# Patient Record
Sex: Female | Born: 1990 | Hispanic: Yes | Marital: Single | Smoking: Never smoker
Health system: Southern US, Community
[De-identification: ages and names within clinical notes are randomized; demographics above are authoritative.]

## PROBLEM LIST (undated history)

## (undated) DIAGNOSIS — O1410 Severe pre-eclampsia, unspecified trimester: Secondary | ICD-10-CM

## (undated) HISTORY — DX: Severe pre-eclampsia, unspecified trimester: O14.10

## (undated) HISTORY — PX: CHOLECYSTECTOMY: SHX55

---
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
## 2010-12-26 ENCOUNTER — Emergency Department (HOSPITAL_COMMUNITY): Payer: No Typology Code available for payment source

## 2010-12-26 ENCOUNTER — Emergency Department (HOSPITAL_COMMUNITY)
Admission: EM | Admit: 2010-12-26 | Discharge: 2010-12-26 | Disposition: A | Discharge status: Home / Self Care | Payer: No Typology Code available for payment source | Attending: General Surgery | Admitting: General Surgery

## 2010-12-26 DIAGNOSIS — S60229A Contusion of unspecified hand, initial encounter: Secondary | ICD-10-CM | POA: Insufficient documentation

## 2010-12-26 DIAGNOSIS — R079 Chest pain, unspecified: Secondary | ICD-10-CM | POA: Insufficient documentation

## 2010-12-26 DIAGNOSIS — S20219A Contusion of unspecified front wall of thorax, initial encounter: Secondary | ICD-10-CM | POA: Insufficient documentation

## 2010-12-26 DIAGNOSIS — M79609 Pain in unspecified limb: Secondary | ICD-10-CM | POA: Insufficient documentation

## 2010-12-26 DIAGNOSIS — R51 Headache: Secondary | ICD-10-CM | POA: Insufficient documentation

## 2010-12-26 DIAGNOSIS — S301XXA Contusion of abdominal wall, initial encounter: Secondary | ICD-10-CM | POA: Insufficient documentation

## 2010-12-26 DIAGNOSIS — R404 Transient alteration of awareness: Secondary | ICD-10-CM | POA: Insufficient documentation

## 2010-12-26 DIAGNOSIS — S51009A Unspecified open wound of unspecified elbow, initial encounter: Secondary | ICD-10-CM | POA: Insufficient documentation

## 2010-12-26 DIAGNOSIS — N390 Urinary tract infection, site not specified: Secondary | ICD-10-CM | POA: Insufficient documentation

## 2010-12-26 DIAGNOSIS — Y92410 Unspecified street and highway as the place of occurrence of the external cause: Secondary | ICD-10-CM | POA: Insufficient documentation

## 2010-12-26 DIAGNOSIS — M25529 Pain in unspecified elbow: Secondary | ICD-10-CM | POA: Insufficient documentation

## 2010-12-26 DIAGNOSIS — R1032 Left lower quadrant pain: Secondary | ICD-10-CM | POA: Insufficient documentation

## 2010-12-26 DIAGNOSIS — M25559 Pain in unspecified hip: Secondary | ICD-10-CM | POA: Insufficient documentation

## 2010-12-26 LAB — URINALYSIS, ROUTINE W REFLEX MICROSCOPIC
Bilirubin Urine: NEGATIVE
Specific Gravity, Urine: 1.014 (ref 1.005–1.030)
pH: 5.5 (ref 5.0–8.0)

## 2010-12-26 LAB — POCT I-STAT, CHEM 8
BUN: 7 mg/dL (ref 6–23)
Chloride: 106 meq/L (ref 96–112)
Creatinine, Ser: 0.8 mg/dL (ref 0.4–1.2)
Hemoglobin: 14.6 g/dL (ref 12.0–15.0)
Potassium: 3.6 meq/L (ref 3.5–5.1)
Sodium: 138 meq/L (ref 135–145)

## 2010-12-26 LAB — CBC
HCT: 41.1 % (ref 36.0–46.0)
Hemoglobin: 14.6 g/dL (ref 12.0–15.0)
MCH: 29.1 pg (ref 26.0–34.0)
RBC: 5.01 MIL/uL (ref 3.87–5.11)

## 2010-12-26 LAB — COMPREHENSIVE METABOLIC PANEL
AST: 39 U/L — ABNORMAL HIGH (ref 0–37)
Albumin: 4.1 g/dL (ref 3.5–5.2)
Calcium: 9.2 mg/dL (ref 8.4–10.5)
Chloride: 105 meq/L (ref 96–112)
Creatinine, Ser: 0.66 mg/dL (ref 0.4–1.2)
GFR calc Af Amer: >60 mL/min (ref >60)
Sodium: 135 meq/L (ref 135–145)

## 2010-12-26 LAB — POCT I-STAT 3, ART BLOOD GAS (G3+)
Patient temperature: 98.6
pH, Arterial: 7.421 — ABNORMAL HIGH (ref 7.350–7.400)

## 2010-12-26 LAB — URINE MICROSCOPIC-ADD ON

## 2010-12-26 LAB — LACTIC ACID, PLASMA: Lactic Acid, Venous: 2.1 mmol/L (ref 0.5–2.2)

## 2010-12-26 LAB — PROTIME-INR
INR: 1.04 (ref 0.00–1.49)
Prothrombin Time: 13.8 s (ref 11.6–15.2)

## 2010-12-26 MED ORDER — IOHEXOL 300 MG/ML  SOLN
100.0000 mL | Freq: Once | INTRAMUSCULAR | Status: AC | PRN
  Administered 2010-12-26: 100 mL via INTRAVENOUS

## 2014-07-05 LAB — OB RESULTS CONSOLE HGB/HCT, BLOOD
HEMATOCRIT: 37 %
HEMATOCRIT: 37 %
HEMOGLOBIN: 13.5 g/dL
Hemoglobin: 13.5 g/dL

## 2014-07-16 LAB — OB RESULTS CONSOLE GC/CHLAMYDIA
Chlamydia: NEGATIVE
Gonorrhea: NEGATIVE

## 2014-07-16 LAB — OB RESULTS CONSOLE RPR: RPR: NONREACTIVE

## 2014-07-16 LAB — OB RESULTS CONSOLE RUBELLA ANTIBODY, IGM: RUBELLA: IMMUNE

## 2014-07-16 LAB — OB RESULTS CONSOLE ANTIBODY SCREEN: ANTIBODY SCREEN: NEGATIVE

## 2014-07-16 LAB — OB RESULTS CONSOLE HEPATITIS B SURFACE ANTIGEN: HEP B S AG: NEGATIVE

## 2014-07-16 LAB — CYTOLOGY - PAP: Pap Smear: NEGATIVE

## 2014-07-16 LAB — OB RESULTS CONSOLE HIV ANTIBODY (ROUTINE TESTING): HIV: NONREACTIVE

## 2014-07-17 ENCOUNTER — Encounter: Payer: Self-pay | Admitting: Obstetrics & Gynecology

## 2014-07-19 LAB — GLUCOSE TOLERANCE, 1 HOUR (50G) W/O FASTING: Glucose, GTT - 1 Hour: 74 mg/dL (ref ?–200)

## 2014-07-24 ENCOUNTER — Encounter: Payer: Self-pay | Admitting: Obstetrics & Gynecology

## 2014-07-25 ENCOUNTER — Encounter: Payer: Self-pay | Admitting: *Deleted

## 2014-08-02 ENCOUNTER — Encounter: Payer: Self-pay | Admitting: Obstetrics & Gynecology

## 2014-08-02 ENCOUNTER — Ambulatory Visit (INDEPENDENT_AMBULATORY_CARE_PROVIDER_SITE_OTHER): Payer: BC Managed Care – PPO | Admitting: Family

## 2014-08-02 ENCOUNTER — Encounter: Payer: Self-pay | Admitting: Family

## 2014-08-02 ENCOUNTER — Encounter (INDEPENDENT_AMBULATORY_CARE_PROVIDER_SITE_OTHER): Payer: Self-pay

## 2014-08-02 ENCOUNTER — Telehealth: Payer: Self-pay | Admitting: General Practice

## 2014-08-02 VITALS — BP 101/54 | HR 82 | Temp 98.3°F | Ht 62.0 in | Wt 222.4 lb

## 2014-08-02 DIAGNOSIS — O2341 Unspecified infection of urinary tract in pregnancy, first trimester: Secondary | ICD-10-CM

## 2014-08-02 DIAGNOSIS — O09212 Supervision of pregnancy with history of pre-term labor, second trimester: Secondary | ICD-10-CM

## 2014-08-02 DIAGNOSIS — N390 Urinary tract infection, site not specified: Secondary | ICD-10-CM

## 2014-08-02 DIAGNOSIS — O0992 Supervision of high risk pregnancy, unspecified, second trimester: Secondary | ICD-10-CM

## 2014-08-02 DIAGNOSIS — O0990 Supervision of high risk pregnancy, unspecified, unspecified trimester: Secondary | ICD-10-CM | POA: Insufficient documentation

## 2014-08-02 DIAGNOSIS — O09219 Supervision of pregnancy with history of pre-term labor, unspecified trimester: Secondary | ICD-10-CM | POA: Insufficient documentation

## 2014-08-02 DIAGNOSIS — O09292 Supervision of pregnancy with other poor reproductive or obstetric history, second trimester: Secondary | ICD-10-CM

## 2014-08-02 DIAGNOSIS — O09299 Supervision of pregnancy with other poor reproductive or obstetric history, unspecified trimester: Secondary | ICD-10-CM | POA: Insufficient documentation

## 2014-08-02 LAB — POCT URINALYSIS DIP (DEVICE)
GLUCOSE, UA: NEGATIVE mg/dL
Ketones, ur: 15 mg/dL — AB
NITRITE: POSITIVE — AB
Protein, ur: 30 mg/dL — AB
Specific Gravity, Urine: 1.02 (ref 1.005–1.030)
UROBILINOGEN UA: 1 mg/dL (ref 0.0–1.0)
pH: 6 (ref 5.0–8.0)

## 2014-08-02 MED ORDER — CEPHALEXIN 500 MG PO CAPS: 500.0000 mg | ORAL_CAPSULE | Freq: Three times a day (TID) | ORAL | Status: DC

## 2014-08-02 NOTE — Telephone Encounter (Signed)
Patient called and left message in spanish, had Darl PikesSusan listen to message. Patient calling stating her job is giving her a hard time about having to come here often for appointments and she needs a letter stating she has a high-risk pregnancy and will need to be here often for appointments. Dr Macon LargeAnyanwu wrote letter for patient. Called patient with Darl PikesSusan to inform patient of letter waiting in the office for her to come by and pickup. Patient verbalized understanding and had no other questions

## 2014-08-02 NOTE — Addendum Note (Signed)
Addended by: Aldona LentoFISHER, Tynetta Bachmann L on: 08/02/2014 11:35 AM   Modules accepted: Orders

## 2014-08-02 NOTE — Addendum Note (Signed)
Addended by: Marlis EdelsonKARIM, Mariana Wiederholt N on: 08/02/2014 11:30 AM   Modules accepted: Orders

## 2014-08-02 NOTE — Progress Notes (Signed)
[facility] and requested records as we do not yet have them. Patient reports having all blood work and early glucose testing-- unsure of results.  Reports bilateral hip pain and lower back pain-- especially after walking.  New OB packet given.  Discussed appropriate weight gain(11-20lb); pt. Verbalized understanding.

## 2014-08-02 NOTE — Progress Notes (Signed)
Pt is a transfer from health dept for hx of SGA and preterm delivery.  Pt had all lab work done there.  Need to obtain records.  Pt reports FOB recently detained by immigration services and will be deported back to [place] > to Child psychotherapistsocial worker to discuss resources.  Currently works as [employer].  Undecided about genetic testing.  Discussed and offered 17-p; pt will completed paperwork today.  Nitrites in urine; RX Keflex given to pt and urine culture sent.

## 2014-08-04 LAB — CULTURE, OB URINE: Colony Count: >=100000

## 2014-08-08 ENCOUNTER — Encounter: Payer: Self-pay | Admitting: *Deleted

## 2014-08-20 ENCOUNTER — Encounter: Payer: Self-pay | Admitting: *Deleted

## 2014-08-23 ENCOUNTER — Encounter: Payer: Self-pay | Admitting: Family

## 2014-08-23 ENCOUNTER — Ambulatory Visit (INDEPENDENT_AMBULATORY_CARE_PROVIDER_SITE_OTHER): Payer: BC Managed Care – PPO | Admitting: Family

## 2014-08-23 VITALS — BP 102/80 | HR 86 | Temp 98.4°F | Wt 223.8 lb

## 2014-08-23 DIAGNOSIS — Z23 Encounter for immunization: Secondary | ICD-10-CM | POA: Diagnosis not present

## 2014-08-23 DIAGNOSIS — O0992 Supervision of high risk pregnancy, unspecified, second trimester: Secondary | ICD-10-CM

## 2014-08-23 LAB — POCT URINALYSIS DIP (DEVICE)
Bilirubin Urine: NEGATIVE
Glucose, UA: NEGATIVE mg/dL
Hgb urine dipstick: NEGATIVE
Ketones, ur: 15 mg/dL — AB
Leukocytes, UA: NEGATIVE
NITRITE: NEGATIVE
Protein, ur: NEGATIVE mg/dL
Specific Gravity, Urine: 1.02 (ref 1.005–1.030)
Urobilinogen, UA: 1 mg/dL (ref 0.0–1.0)
pH: 7 (ref 5.0–8.0)

## 2014-08-23 NOTE — Progress Notes (Signed)
Patient never started keflex.

## 2014-08-23 NOTE — Progress Notes (Signed)
No questions or concerns.  Desires quad screen.  Obtain in three weeks with appt.  Scheduled OB ultrasound for 19 wks.  Return next week to begin 17-p.

## 2014-08-27 ENCOUNTER — Encounter: Payer: Self-pay | Admitting: Family

## 2014-08-30 ENCOUNTER — Ambulatory Visit: Payer: BC Managed Care – PPO | Admitting: General Practice

## 2014-08-30 ENCOUNTER — Encounter: Payer: Self-pay | Admitting: Obstetrics & Gynecology

## 2014-08-30 VITALS — BP 118/67 | HR 67 | Temp 98.2°F | Ht 62.0 in | Wt 222.3 lb

## 2014-08-30 DIAGNOSIS — Z719 Counseling, unspecified: Secondary | ICD-10-CM

## 2014-08-30 NOTE — Progress Notes (Signed)
Patient here today for 17p. 17p not in office thus not administered today. Refaxed makena paperwork and asked to send asap.

## 2014-09-06 ENCOUNTER — Encounter: Payer: Self-pay | Admitting: *Deleted

## 2014-09-06 ENCOUNTER — Ambulatory Visit: Payer: BC Managed Care – PPO

## 2014-09-06 ENCOUNTER — Encounter: Payer: Self-pay | Admitting: Obstetrics and Gynecology

## 2014-09-06 NOTE — Progress Notes (Signed)
/  [facility] 08[facility]/14.  [facility] states they did not receive application.  Application refaxed on 08/30/14.  I received an email from [place] [facility] on 08/31/14 stating that the medication was covered under the patient's BCBS of [place] PPO.  No pre-auth was required and the [facility] will [facility].  Today patient comes to office for injection and medication has not been delivered to the office.  Phone call to [facility] at [phone].  Spoke with Kathlene NovemberMike who entered patient's demographic information.  Then spoke with Iris a pharmacist to give verbal order.  Explained situation and the need to get medication ASAP.  Iris states she will put a STAT on it from their perspective but they will still need to speak to the patient to get authorization to ship the medication to our office.  I asked if the patient could call them.  Iris states that will be fine but to ask patient not to call until sometime between 4-5 pm today as they will need a little time to get the information loaded into their system so they will be able to properly see her information when she calls in.  Phoned patient.  Explained situation to patient.  Asked patient to call [facility] at [phone] between 4 - 5 pm today.  Told patient to tell them she needs to check on the status of her medication and authorize shipment to the provider office.  Patient states understanding.  Explained that if patient calls today, we should have her medication by Monday of next week.  Patient has next appointment on 09/13/14.  She will see the provider and get her makena injection that day.  Patient declined to come into office on Monday for injection only due to transportation issues.  Will follow-up with call to [facility] tomorrow to be certain medication is being shipped ASAP.

## 2014-09-07 ENCOUNTER — Encounter: Payer: Self-pay | Admitting: *Deleted

## 2014-09-07 NOTE — Progress Notes (Signed)
Phone call to [facility] [phone], spoke with Arlene.  States she doesn't see where the patient has phoned in to give authorization [facility].  I placed her on hold and called patient and connected her to get issue resolved.  Patient states she phoned [facility] yesterday and was told they were trying to verify benefits.  While on the phone with Arlene the patient did authorize that makena be shipped to our office.  Arlene states once benefits are verified (which will take 24-48 hours) they will reach out to patient to let her know of any copay which she would be responsible for.  Patient agrees and states understanding.  I will follow up with [facility] again on Monday to ensure the issue has been resolved and medication will be available in our office for the patient at her next appointment on 09/13/14.

## 2014-09-11 ENCOUNTER — Telehealth: Payer: Self-pay

## 2014-09-11 NOTE — Telephone Encounter (Signed)
17P has still not arrived in office. Called [facility] [phone] and spoke to BuxtonShanise. She states they had tried to contact patient today for consent and were unable to reach her. [facility] and spoke to [place]. She informed me that benefits were verified later on during the day on 09/07/14. Ashante states patient could not have given consent until benefits were verified. They attempted to contact patient today to inform her of coverage and obtain consent. Were unable to reach her. States patient needs to contact [facility] at number listed above-- hit option 3 and then option 1. Once consent is obtained, 17P will then be shipped. Called patient with interpreter [phone]. Informed her [facility] has been trying to reach her. Gave her number to reach [facility] as well as directions to obtain correct contact person. Patient stated as soon as she gets off phone she will call. No questions or concerns.

## 2014-09-12 ENCOUNTER — Encounter: Payer: Self-pay | Admitting: *Deleted

## 2014-09-12 NOTE — Progress Notes (Signed)
Spoke with Efraim KaufmannMelissa at [facility] [phone].  States benefits have been verified and they have spoken with patient to let her know.  They have scheduled delivery of medication to be overnighted.  Will arrive via UPS at approximately 10:30 am on 09/12/14.  Patient has appointment on Thursday 09/13/14.  Melissa states they will check benefits when refill is required and will contact us to arrange delivery of refill once they have spoken with the patient.

## 2014-09-13 ENCOUNTER — Ambulatory Visit (INDEPENDENT_AMBULATORY_CARE_PROVIDER_SITE_OTHER): Payer: BC Managed Care – PPO | Admitting: Family Medicine

## 2014-09-13 ENCOUNTER — Encounter: Payer: Self-pay | Admitting: Family Medicine

## 2014-09-13 VITALS — BP 103/64 | HR 88 | Temp 98.1°F | Wt 222.3 lb

## 2014-09-13 DIAGNOSIS — Z8751 Personal history of pre-term labor: Secondary | ICD-10-CM | POA: Diagnosis not present

## 2014-09-13 DIAGNOSIS — O0992 Supervision of high risk pregnancy, unspecified, second trimester: Secondary | ICD-10-CM

## 2014-09-13 DIAGNOSIS — G4489 Other headache syndrome: Secondary | ICD-10-CM

## 2014-09-13 LAB — POCT URINALYSIS DIP (DEVICE)
GLUCOSE, UA: NEGATIVE mg/dL
Ketones, ur: 40 mg/dL — AB
Leukocytes, UA: NEGATIVE
NITRITE: NEGATIVE
PH: 6 (ref 5.0–8.0)
Protein, ur: NEGATIVE mg/dL
Specific Gravity, Urine: 1.025 (ref 1.005–1.030)
Urobilinogen, UA: 1 mg/dL (ref 0.0–1.0)

## 2014-09-13 MED ORDER — CYCLOBENZAPRINE HCL 10 MG PO TABS: 10.0000 mg | ORAL_TABLET | Freq: Three times a day (TID) | ORAL | Status: AC | PRN

## 2014-09-13 MED ORDER — HYDROXYPROGESTERONE CAPROATE 250 MG/ML IM OIL
250.0000 mg | TOPICAL_OIL | INTRAMUSCULAR | Status: AC
  Administered 2014-09-13 – 2015-01-17 (×15): 250 mg via INTRAMUSCULAR

## 2014-09-13 NOTE — Progress Notes (Signed)
Quad screen today Flexeril for headache 17P today and weekly May see Nutrition--discussed normal fetal growth.

## 2014-09-13 NOTE — Patient Instructions (Signed)
Second Trimester of Pregnancy The second trimester is from week 13 through week 28, months 4 through 6. The second trimester is often a time when you feel your best. Your body has also adjusted to being pregnant, and you begin to feel better physically. Usually, morning sickness has lessened or quit completely, you may have more energy, and you may have an increase in appetite. The second trimester is also a time when the fetus is growing rapidly. At the end of the sixth month, the fetus is about 9 inches long and weighs about 1 pounds. You will likely begin to feel the baby move (quickening) between 18 and 20 weeks of the pregnancy. BODY CHANGES Your body goes through many changes during pregnancy. The changes vary from woman to woman.   Your weight will continue to increase. You will notice your lower abdomen bulging out.  You may begin to get stretch marks on your hips, abdomen, and breasts.  You may develop headaches that can be relieved by medicines approved by your health care provider.  You may urinate more often because the fetus is pressing on your bladder.  You may develop or continue to have heartburn as a result of your pregnancy.  You may develop constipation because certain hormones are causing the muscles that push waste through your intestines to slow down.  You may develop hemorrhoids or swollen, bulging veins (varicose veins).  You may have back pain because of the weight gain and pregnancy hormones relaxing your joints between the bones in your pelvis and as a result of a shift in weight and the muscles that support your balance.  Your breasts will continue to grow and be tender.  Your gums may bleed and may be sensitive to brushing and flossing.  Dark spots or blotches (chloasma, mask of pregnancy) may develop on your face. This will likely fade after the baby is born.  A dark line from your belly button to the pubic area (linea nigra) may appear. This will likely  fade after the baby is born.  You may have changes in your hair. These can include thickening of your hair, rapid growth, and changes in texture. Some women also have hair loss during or after pregnancy, or hair that feels dry or thin. Your hair will most likely return to normal after your baby is born. WHAT TO EXPECT AT YOUR PRENATAL VISITS During a routine prenatal visit:  You will be weighed to make sure you and the fetus are growing normally.  Your blood pressure will be taken.  Your abdomen will be measured to track your baby's growth.  The fetal heartbeat will be listened to.  Any test results from the previous visit will be discussed. Your health care provider may ask you:  How you are feeling.  If you are feeling the baby move.  If you have had any abnormal symptoms, such as leaking fluid, bleeding, severe headaches, or abdominal cramping.  If you have any questions. Other tests that may be performed during your second trimester include:  Blood tests that check for:  Low iron levels (anemia).  Gestational diabetes (between 24 and 28 weeks).  Rh antibodies.  Urine tests to check for infections, diabetes, or protein in the urine.  An ultrasound to confirm the proper growth and development of the baby.  An amniocentesis to check for possible genetic problems.  Fetal screens for spina bifida and Down syndrome. HOME CARE INSTRUCTIONS   Avoid all smoking, herbs, alcohol, and unprescribed   drugs. These chemicals affect the formation and growth of the baby.  Follow your health care provider's instructions regarding medicine use. There are medicines that are either safe or unsafe to take during pregnancy.  Exercise only as directed by your health care provider. Experiencing uterine cramps is a good sign to stop exercising.  Continue to eat regular, healthy meals.  Wear a good support bra for breast tenderness.  Do not use hot tubs, steam rooms, or saunas.  Wear  your seat belt at all times when driving.  Avoid raw meat, uncooked cheese, cat litter boxes, and soil used by cats. These carry germs that can cause birth defects in the baby.  Take your prenatal vitamins.  Try taking a stool softener (if your health care provider approves) if you develop constipation. Eat more high-fiber foods, such as fresh vegetables or fruit and whole grains. Drink plenty of fluids to keep your urine clear or pale yellow.  Take warm sitz baths to soothe any pain or discomfort caused by hemorrhoids. Use hemorrhoid cream if your health care provider approves.  If you develop varicose veins, wear support hose. Elevate your feet for 15 minutes, 3-4 times a day. Limit salt in your diet.  Avoid heavy lifting, wear low heel shoes, and practice good posture.  Rest with your legs elevated if you have leg cramps or low back pain.  Visit your dentist if you have not gone yet during your pregnancy. Use a soft toothbrush to brush your teeth and be gentle when you floss.  A sexual relationship may be continued unless your health care provider directs you otherwise.  Continue to go to all your prenatal visits as directed by your health care provider. SEEK MEDICAL CARE IF:   You have dizziness.  You have mild pelvic cramps, pelvic pressure, or nagging pain in the abdominal area.  You have persistent nausea, vomiting, or diarrhea.  You have a bad smelling vaginal discharge.  You have pain with urination. SEEK IMMEDIATE MEDICAL CARE IF:   You have a fever.  You are leaking fluid from your vagina.  You have spotting or bleeding from your vagina.  You have severe abdominal cramping or pain.  You have rapid weight gain or loss.  You have shortness of breath with chest pain.  You notice sudden or extreme swelling of your face, hands, ankles, feet, or legs.  You have not felt your baby move in over an hour.  You have severe headaches that do not go away with  medicine.  You have vision changes. Document Released: 10/06/2001 Document Revised: 10/17/2013 Document Reviewed: 12/13/2012 [employer] Patient Information 2015 [employer]. This information is not intended to replace advice given to you by your health care provider. Make sure you discuss any questions you have with your health care provider.  Breastfeeding Deciding to breastfeed is one of the best choices you can make for you and your baby. A change in hormones during pregnancy causes your breast tissue to grow and increases the number and size of your milk ducts. These hormones also allow proteins, sugars, and fats from your blood supply to make breast milk in your milk-producing glands. Hormones prevent breast milk from being released before your baby is born as well as prompt milk flow after birth. Once breastfeeding has begun, thoughts of your baby, as well as his or her sucking or crying, can stimulate the release of milk from your milk-producing glands.  BENEFITS OF BREASTFEEDING For Your Baby  Your first   milk (colostrum) helps your baby's digestive system function better.   There are antibodies in your milk that help your baby fight off infections.   Your baby has a lower incidence of asthma, allergies, and sudden infant death syndrome.   The nutrients in breast milk are better for your baby than infant formulas and are designed uniquely for your baby's needs.   Breast milk improves your baby's brain development.   Your baby is less likely to develop other conditions, such as childhood obesity, asthma, or type 2 diabetes mellitus.  For You   Breastfeeding helps to create a very special bond between you and your baby.   Breastfeeding is convenient. Breast milk is always available at the correct temperature and costs nothing.   Breastfeeding helps to burn calories and helps you lose the weight gained during pregnancy.   Breastfeeding makes your uterus contract to its  prepregnancy size faster and slows bleeding (lochia) after you give birth.   Breastfeeding helps to lower your risk of developing type 2 diabetes mellitus, osteoporosis, and breast or ovarian cancer later in life. SIGNS THAT YOUR BABY IS HUNGRY Early Signs of Hunger  Increased alertness or activity.  Stretching.  Movement of the head from side to side.  Movement of the head and opening of the mouth when the corner of the mouth or cheek is stroked (rooting).  Increased sucking sounds, smacking lips, cooing, sighing, or squeaking.  Hand-to-mouth movements.  Increased sucking of fingers or hands. Late Signs of Hunger  Fussing.  Intermittent crying. Extreme Signs of Hunger Signs of extreme hunger will require calming and consoling before your baby will be able to breastfeed successfully. Do not wait for the following signs of extreme hunger to occur before you initiate breastfeeding:   Restlessness.  A loud, strong cry.   Screaming. BREASTFEEDING BASICS Breastfeeding Initiation  Find a comfortable place to sit or lie down, with your neck and back well supported.  Place a pillow or rolled up blanket under your baby to bring him or her to the level of your breast (if you are seated). Nursing pillows are specially designed to help support your arms and your baby while you breastfeed.  Make sure that your baby's abdomen is facing your abdomen.   Gently massage your breast. With your fingertips, massage from your chest wall toward your nipple in a circular motion. This encourages milk flow. You may need to continue this action during the feeding if your milk flows slowly.  Support your breast with 4 fingers underneath and your thumb above your nipple. Make sure your fingers are well away from your nipple and your baby's mouth.   Stroke your baby's lips gently with your finger or nipple.   When your baby's mouth is open wide enough, quickly bring your baby to your breast,  placing your entire nipple and as much of the colored area around your nipple (areola) as possible into your baby's mouth.   More areola should be visible above your baby's upper lip than below the lower lip.   Your baby's tongue should be between his or her lower gum and your breast.   Ensure that your baby's mouth is correctly positioned around your nipple (latched). Your baby's lips should create a seal on your breast and be turned out (everted).  It is common for your baby to suck about 2-3 minutes in order to start the flow of breast milk. Latching Teaching your baby how to latch on to your breast   properly is very important. An improper latch can cause nipple pain and decreased milk supply for you and poor weight gain in your baby. Also, if your baby is not latched onto your nipple properly, he or she may swallow some air during feeding. This can make your baby fussy. Burping your baby when you switch breasts during the feeding can help to get rid of the air. However, teaching your baby to latch on properly is still the best way to prevent fussiness from swallowing air while breastfeeding. Signs that your baby has successfully latched on to your nipple:    Silent tugging or silent sucking, without causing you pain.   Swallowing heard between every 3-4 sucks.    Muscle movement above and in front of his or her ears while sucking.  Signs that your baby has not successfully latched on to nipple:   Sucking sounds or smacking sounds from your baby while breastfeeding.  Nipple pain. If you think your baby has not latched on correctly, slip your finger into the corner of your baby's mouth to break the suction and place it between your baby's gums. Attempt breastfeeding initiation again. Signs of Successful Breastfeeding Signs from your baby:   A gradual decrease in the number of sucks or complete cessation of sucking.   Falling asleep.   Relaxation of his or her body.    Retention of a small amount of milk in his or her mouth.   Letting go of your breast by himself or herself. Signs from you:  Breasts that have increased in firmness, weight, and size 1-3 hours after feeding.   Breasts that are softer immediately after breastfeeding.  Increased milk volume, as well as a change in milk consistency and color by the fifth day of breastfeeding.   Nipples that are not sore, cracked, or bleeding. Signs That Your Baby is Getting Enough Milk  Wetting at least 3 diapers in a 24-hour period. The urine should be clear and pale yellow by age 5 days.  At least 3 stools in a 24-hour period by age 5 days. The stool should be soft and yellow.  At least 3 stools in a 24-hour period by age 7 days. The stool should be seedy and yellow.  No loss of weight greater than 10% of birth weight during the first 3 days of age.  Average weight gain of 4-7 ounces (113-198 g) per week after age 4 days.  Consistent daily weight gain by age 5 days, without weight loss after the age of 2 weeks. After a feeding, your baby may spit up a small amount. This is common. BREASTFEEDING FREQUENCY AND DURATION Frequent feeding will help you make more milk and can prevent sore nipples and breast engorgement. Breastfeed when you feel the need to reduce the fullness of your breasts or when your baby shows signs of hunger. This is called "breastfeeding on demand." Avoid introducing a pacifier to your baby while you are working to establish breastfeeding (the first 4-6 weeks after your baby is born). After this time you may choose to use a pacifier. Research has shown that pacifier use during the first year of a baby's life decreases the risk of sudden infant death syndrome (SIDS). Allow your baby to feed on each breast as long as he or she wants. Breastfeed until your baby is finished feeding. When your baby unlatches or falls asleep while feeding from the first breast, offer the second breast.  Because newborns are often sleepy in the   first few weeks of life, you may need to awaken your baby to get him or her to feed. Breastfeeding times will vary from baby to baby. However, the following rules can serve as a guide to help you ensure that your baby is properly fed:  Newborns (babies 4 weeks of age or younger) may breastfeed every 1-3 hours.  Newborns should not go longer than 3 hours during the day or 5 hours during the night without breastfeeding.  You should breastfeed your baby a minimum of 8 times in a 24-hour period until you begin to introduce solid foods to your baby at around 6 months of age. BREAST MILK PUMPING Pumping and storing breast milk allows you to ensure that your baby is exclusively fed your breast milk, even at times when you are unable to breastfeed. This is especially important if you are going back to work while you are still breastfeeding or when you are not able to be present during feedings. Your lactation consultant can give you guidelines on how long it is safe to store breast milk.  A breast pump is a machine that allows you to pump milk from your breast into a sterile bottle. The pumped breast milk can then be stored in a refrigerator or freezer. Some breast pumps are operated by hand, while others use electricity. Ask your lactation consultant which type will work best for you. Breast pumps can be purchased, but some hospitals and breastfeeding support groups lease breast pumps on a monthly basis. A lactation consultant can teach you how to hand express breast milk, if you prefer not to use a pump.  CARING FOR YOUR BREASTS WHILE YOU BREASTFEED Nipples can become dry, cracked, and sore while breastfeeding. The following recommendations can help keep your breasts moisturized and healthy:  Avoid using soap on your nipples.   Wear a supportive bra. Although not required, special nursing bras and tank tops are designed to allow access to your breasts for  breastfeeding without taking off your entire bra or top. Avoid wearing underwire-style bras or extremely tight bras.  Air dry your nipples for 3-4minutes after each feeding.   Use only cotton bra pads to absorb leaked breast milk. Leaking of breast milk between feedings is normal.   Use lanolin on your nipples after breastfeeding. Lanolin helps to maintain your skin's normal moisture barrier. If you use pure lanolin, you do not need to wash it off before feeding your baby again. Pure lanolin is not toxic to your baby. You may also hand express a few drops of breast milk and gently massage that milk into your nipples and allow the milk to air dry. In the first few weeks after giving birth, some women experience extremely full breasts (engorgement). Engorgement can make your breasts feel heavy, warm, and tender to the touch. Engorgement peaks within 3-5 days after you give birth. The following recommendations can help ease engorgement:  Completely empty your breasts while breastfeeding or pumping. You may want to start by applying warm, moist heat (in the shower or with warm water-soaked hand towels) just before feeding or pumping. This increases circulation and helps the milk flow. If your baby does not completely empty your breasts while breastfeeding, pump any extra milk after he or she is finished.  Wear a snug bra (nursing or regular) or tank top for 1-2 days to signal your body to slightly decrease milk production.  Apply ice packs to your breasts, unless this is too uncomfortable for you.    Make sure that your baby is latched on and positioned properly while breastfeeding. If engorgement persists after 48 hours of following these recommendations, contact your health care provider or a lactation consultant. OVERALL HEALTH CARE RECOMMENDATIONS WHILE BREASTFEEDING  Eat healthy foods. Alternate between meals and snacks, eating 3 of each per day. Because what you eat affects your breast milk,  some of the foods may make your baby more irritable than usual. Avoid eating these foods if you are sure that they are negatively affecting your baby.  Drink milk, fruit juice, and water to satisfy your thirst (about 10 glasses a day).   Rest often, relax, and continue to take your prenatal vitamins to prevent fatigue, stress, and anemia.  Continue breast self-awareness checks.  Avoid chewing and smoking tobacco.  Avoid alcohol and drug use. Some medicines that may be harmful to your baby can pass through breast milk. It is important to ask your health care provider before taking any medicine, including all over-the-counter and prescription medicine as well as vitamin and herbal supplements. It is possible to become pregnant while breastfeeding. If birth control is desired, ask your health care provider about options that will be safe for your baby. SEEK MEDICAL CARE IF:   You feel like you want to stop breastfeeding or have become frustrated with breastfeeding.  You have painful breasts or nipples.  Your nipples are cracked or bleeding.  Your breasts are red, tender, or warm.  You have a swollen area on either breast.  You have a fever or chills.  You have nausea or vomiting.  You have drainage other than breast milk from your nipples.  Your breasts do not become full before feedings by the fifth day after you give birth.  You feel sad and depressed.  Your baby is too sleepy to eat well.  Your baby is having trouble sleeping.   Your baby is wetting less than 3 diapers in a 24-hour period.  Your baby has less than 3 stools in a 24-hour period.  Your baby's skin or the white part of his or her eyes becomes yellow.   Your baby is not gaining weight by 5 days of age. SEEK IMMEDIATE MEDICAL CARE IF:   Your baby is overly tired (lethargic) and does not want to wake up and feed.  Your baby develops an unexplained fever. Document Released: 10/12/2005 Document Revised:  10/17/2013 Document Reviewed: 04/05/2013 [employer] Patient Information 2015 [employer]. This information is not intended to replace advice given to you by your health care provider. Make sure you discuss any questions you have with your health care provider.  

## 2014-09-13 NOTE — Progress Notes (Signed)
Reports bad headaches that started in the last week, took tylenol yesterday and it helped minimal- denies blurry vision or dizziness; reports feeling exhausted to the point where it's hard to do her job; has a lot of lower back pain. Reports concern over lack of weight gain, has minimal appetite.

## 2014-09-17 LAB — AFP, QUAD SCREEN
AFP: 48.9 ng/mL
Curr Gest Age: 18.2 wks.days
Down Syndrome Scr Risk Est: 1:29200 {titer}
HCG, Total: 31.71 IU/mL
INH: 156 pg/mL
Interpretation-AFP: NEGATIVE
MOM FOR AFP: 1.47
MoM for INH: 1.39
MoM for hCG: 1.78
Open Spina bifida: NEGATIVE
Osb Risk: 1:3040 {titer}
Tri 18 Scr Risk Est: NEGATIVE
uE3 Mom: 1.31
uE3 Value: 1.78 ng/mL

## 2014-09-18 ENCOUNTER — Encounter: Payer: Self-pay | Admitting: *Deleted

## 2014-09-19 ENCOUNTER — Ambulatory Visit (INDEPENDENT_AMBULATORY_CARE_PROVIDER_SITE_OTHER): Payer: BC Managed Care – PPO | Admitting: *Deleted

## 2014-09-19 VITALS — BP 114/53 | HR 104 | Wt 222.8 lb

## 2014-09-19 DIAGNOSIS — O09212 Supervision of pregnancy with history of pre-term labor, second trimester: Secondary | ICD-10-CM | POA: Diagnosis not present

## 2014-09-19 NOTE — Progress Notes (Signed)
Pt presented for scheduled 17P injection.  Medication administered without problems. Next injection scheduled 12/3.

## 2014-09-24 ENCOUNTER — Ambulatory Visit: Payer: BC Managed Care – PPO

## 2014-09-24 ENCOUNTER — Ambulatory Visit (HOSPITAL_COMMUNITY)
Admission: RE | Admit: 2014-09-24 | Discharge: 2014-09-24 | Disposition: A | Discharge status: Home / Self Care | Payer: BC Managed Care – PPO | Source: Ambulatory Visit | Attending: Family | Admitting: Family

## 2014-09-24 DIAGNOSIS — Z36 Encounter for antenatal screening of mother: Secondary | ICD-10-CM | POA: Insufficient documentation

## 2014-09-24 DIAGNOSIS — O09219 Supervision of pregnancy with history of pre-term labor, unspecified trimester: Secondary | ICD-10-CM

## 2014-09-24 DIAGNOSIS — Z1389 Encounter for screening for other disorder: Secondary | ICD-10-CM | POA: Insufficient documentation

## 2014-09-24 DIAGNOSIS — Z3A19 19 weeks gestation of pregnancy: Secondary | ICD-10-CM | POA: Insufficient documentation

## 2014-09-24 DIAGNOSIS — O09212 Supervision of pregnancy with history of pre-term labor, second trimester: Secondary | ICD-10-CM | POA: Diagnosis present

## 2014-09-24 DIAGNOSIS — O09899 Supervision of other high risk pregnancies, unspecified trimester: Secondary | ICD-10-CM | POA: Insufficient documentation

## 2014-09-24 DIAGNOSIS — Z23 Encounter for immunization: Secondary | ICD-10-CM

## 2014-09-27 ENCOUNTER — Ambulatory Visit (INDEPENDENT_AMBULATORY_CARE_PROVIDER_SITE_OTHER): Payer: BC Managed Care – PPO | Admitting: General Practice

## 2014-09-27 VITALS — BP 120/45 | HR 69 | Temp 98.3°F | Ht 62.0 in | Wt 222.8 lb

## 2014-09-27 DIAGNOSIS — O09212 Supervision of pregnancy with history of pre-term labor, second trimester: Secondary | ICD-10-CM | POA: Diagnosis not present

## 2014-09-27 DIAGNOSIS — O09892 Supervision of other high risk pregnancies, second trimester: Secondary | ICD-10-CM

## 2014-10-04 ENCOUNTER — Ambulatory Visit: Payer: BC Managed Care – PPO | Admitting: *Deleted

## 2014-10-04 DIAGNOSIS — Z7189 Other specified counseling: Secondary | ICD-10-CM

## 2014-10-04 NOTE — Progress Notes (Signed)
Attempted to call and reorder Makena. Spoke with a representative who states that she must speak with the patient to confirm that she wants to refill the medication. Once they have spoken with the patient, they will call us back to complete the reorder process.

## 2014-10-11 ENCOUNTER — Ambulatory Visit (INDEPENDENT_AMBULATORY_CARE_PROVIDER_SITE_OTHER): Payer: BC Managed Care – PPO

## 2014-10-11 ENCOUNTER — Encounter: Payer: BC Managed Care – PPO | Admitting: Obstetrics & Gynecology

## 2014-10-11 VITALS — BP 103/61 | HR 78 | Wt 225.6 lb

## 2014-10-11 DIAGNOSIS — O09892 Supervision of other high risk pregnancies, second trimester: Secondary | ICD-10-CM

## 2014-10-11 DIAGNOSIS — O09212 Supervision of pregnancy with history of pre-term labor, second trimester: Secondary | ICD-10-CM

## 2014-10-11 NOTE — Progress Notes (Signed)
Message left in patient's appt notes [facility] due to a representative needing to confirm that she wants her medication sent to our office.  Called [phone] with patient and was informed that we would receive Makena on 10/12/14.

## 2014-10-16 ENCOUNTER — Encounter: Payer: Self-pay | Admitting: Obstetrics and Gynecology

## 2014-10-16 ENCOUNTER — Ambulatory Visit (INDEPENDENT_AMBULATORY_CARE_PROVIDER_SITE_OTHER): Payer: BC Managed Care – PPO | Admitting: Obstetrics and Gynecology

## 2014-10-16 VITALS — BP 95/51 | HR 90 | Temp 98.1°F | Wt 220.1 lb

## 2014-10-16 DIAGNOSIS — O0992 Supervision of high risk pregnancy, unspecified, second trimester: Secondary | ICD-10-CM

## 2014-10-16 DIAGNOSIS — IMO0002 Reserved for concepts with insufficient information to code with codable children: Secondary | ICD-10-CM

## 2014-10-16 DIAGNOSIS — O09212 Supervision of pregnancy with history of pre-term labor, second trimester: Secondary | ICD-10-CM | POA: Diagnosis not present

## 2014-10-16 DIAGNOSIS — Z36 Encounter for antenatal screening of mother: Secondary | ICD-10-CM

## 2014-10-16 DIAGNOSIS — Z0489 Encounter for examination and observation for other specified reasons: Secondary | ICD-10-CM

## 2014-10-16 DIAGNOSIS — O09292 Supervision of pregnancy with other poor reproductive or obstetric history, second trimester: Secondary | ICD-10-CM

## 2014-10-16 LAB — POCT URINALYSIS DIP (DEVICE)
BILIRUBIN URINE: NEGATIVE
GLUCOSE, UA: NEGATIVE mg/dL
HGB URINE DIPSTICK: NEGATIVE
Ketones, ur: NEGATIVE mg/dL
Leukocytes, UA: NEGATIVE
Nitrite: NEGATIVE
Protein, ur: NEGATIVE mg/dL
SPECIFIC GRAVITY, URINE: 1.02 (ref 1.005–1.030)
Urobilinogen, UA: 0.2 mg/dL (ref 0.0–1.0)
pH: 7 (ref 5.0–8.0)

## 2014-10-16 NOTE — Progress Notes (Signed)
Patient is doing well without any complaints. PTL precautions reviewed. Continue weekly 17-P. Will schedule follow up anatomy ultrasound today

## 2014-10-16 NOTE — Progress Notes (Signed)
17 P injection No complaints today

## 2014-10-23 ENCOUNTER — Ambulatory Visit (INDEPENDENT_AMBULATORY_CARE_PROVIDER_SITE_OTHER): Payer: BC Managed Care – PPO | Admitting: General Practice

## 2014-10-23 VITALS — BP 102/54 | HR 73 | Temp 98.1°F | Ht 62.0 in | Wt 224.6 lb

## 2014-10-23 DIAGNOSIS — O09212 Supervision of pregnancy with history of pre-term labor, second trimester: Secondary | ICD-10-CM | POA: Diagnosis not present

## 2014-10-23 DIAGNOSIS — Z7189 Other specified counseling: Secondary | ICD-10-CM

## 2014-10-24 ENCOUNTER — Ambulatory Visit (HOSPITAL_COMMUNITY)
Admission: RE | Admit: 2014-10-24 | Discharge: 2014-10-24 | Disposition: A | Discharge status: Home / Self Care | Payer: BC Managed Care – PPO | Source: Ambulatory Visit | Attending: Obstetrics and Gynecology | Admitting: Obstetrics and Gynecology

## 2014-10-24 DIAGNOSIS — O0992 Supervision of high risk pregnancy, unspecified, second trimester: Secondary | ICD-10-CM

## 2014-10-24 DIAGNOSIS — Z36 Encounter for antenatal screening of mother: Secondary | ICD-10-CM | POA: Insufficient documentation

## 2014-10-24 DIAGNOSIS — Z3A24 24 weeks gestation of pregnancy: Secondary | ICD-10-CM | POA: Diagnosis not present

## 2014-10-24 DIAGNOSIS — O09212 Supervision of pregnancy with history of pre-term labor, second trimester: Secondary | ICD-10-CM | POA: Insufficient documentation

## 2014-10-24 DIAGNOSIS — Z0489 Encounter for examination and observation for other specified reasons: Secondary | ICD-10-CM | POA: Insufficient documentation

## 2014-10-24 DIAGNOSIS — IMO0002 Reserved for concepts with insufficient information to code with codable children: Secondary | ICD-10-CM | POA: Insufficient documentation

## 2014-10-26 NOTE — L&D Delivery Note (Signed)
Delivery Note At 9:33 PM a viable female was delivered via Vaginal, Spontaneous Delivery (Presentation: Right Occiput Anterior).  APGAR: 9, 9; weight pending .   Placenta status: Intact, Spontaneous.  Cord:  with the following complications: None.  Cord pH: None  Anesthesia: Epidural  Episiotomy: None Lacerations: None Suture Repair: None Est. Blood Loss (mL): 150  Mom to AICU for postpartum magnesium.  Baby to Couplet care / Skin to Skin.  William DaltonMcEachern, Morgan 01/28/2015, 9:50 PM   `````Attestation of Attending Supervision of Advanced Practitioner: Evaluation and management procedures were performed by the PA/NP/CNM/OB Fellow under my supervision/collaboration. Chart reviewed and agree with management and executed care plan.  Tilda BurrowFERGUSON,Eufemio Strahm V 01/30/2015 8:34 PM

## 2014-10-29 ENCOUNTER — Ambulatory Visit (INDEPENDENT_AMBULATORY_CARE_PROVIDER_SITE_OTHER): Payer: BC Managed Care – PPO

## 2014-10-29 VITALS — BP 107/58 | HR 77 | Temp 98.4°F | Wt 224.9 lb

## 2014-10-29 DIAGNOSIS — O09892 Supervision of other high risk pregnancies, second trimester: Secondary | ICD-10-CM

## 2014-10-29 DIAGNOSIS — O09212 Supervision of pregnancy with history of pre-term labor, second trimester: Secondary | ICD-10-CM

## 2014-10-29 NOTE — Progress Notes (Signed)
Patient here today for weekly injection of 17P. 17P administered into RUO quadrant of buttocks. Patient tolerated well. NO questions, concerns or problems to report. To return next week for next injection.

## 2014-10-30 ENCOUNTER — Ambulatory Visit: Payer: BC Managed Care – PPO

## 2014-11-06 ENCOUNTER — Ambulatory Visit: Payer: BC Managed Care – PPO

## 2014-11-08 ENCOUNTER — Ambulatory Visit (INDEPENDENT_AMBULATORY_CARE_PROVIDER_SITE_OTHER): Payer: Self-pay | Admitting: General Practice

## 2014-11-08 VITALS — BP 119/63 | HR 62 | Temp 97.9°F | Ht 62.0 in | Wt 223.9 lb

## 2014-11-08 DIAGNOSIS — O09213 Supervision of pregnancy with history of pre-term labor, third trimester: Secondary | ICD-10-CM

## 2014-11-08 DIAGNOSIS — O09893 Supervision of other high risk pregnancies, third trimester: Secondary | ICD-10-CM

## 2014-11-08 DIAGNOSIS — Z7189 Other specified counseling: Secondary | ICD-10-CM

## 2014-11-13 ENCOUNTER — Ambulatory Visit (INDEPENDENT_AMBULATORY_CARE_PROVIDER_SITE_OTHER): Payer: BC Managed Care – PPO | Admitting: Physician Assistant

## 2014-11-13 VITALS — BP 106/61 | HR 80 | Temp 98.3°F | Wt 223.7 lb

## 2014-11-13 DIAGNOSIS — Z23 Encounter for immunization: Secondary | ICD-10-CM

## 2014-11-13 DIAGNOSIS — O09892 Supervision of other high risk pregnancies, second trimester: Secondary | ICD-10-CM

## 2014-11-13 DIAGNOSIS — O0992 Supervision of high risk pregnancy, unspecified, second trimester: Secondary | ICD-10-CM

## 2014-11-13 DIAGNOSIS — Z3492 Encounter for supervision of normal pregnancy, unspecified, second trimester: Secondary | ICD-10-CM

## 2014-11-13 DIAGNOSIS — O09212 Supervision of pregnancy with history of pre-term labor, second trimester: Secondary | ICD-10-CM

## 2014-11-13 LAB — CBC
HEMATOCRIT: 38.5 % (ref 36.0–46.0)
Hemoglobin: 13.1 g/dL (ref 12.0–15.0)
MCH: 28.8 pg (ref 26.0–34.0)
MCHC: 34 g/dL (ref 30.0–36.0)
MCV: 84.6 fL (ref 78.0–100.0)
MPV: 9.7 fL (ref 8.6–12.4)
Platelets: 302 10*3/uL (ref 150–400)
RBC: 4.55 MIL/uL (ref 3.87–5.11)
RDW: 13 % (ref 11.5–15.5)
WBC: 7.2 10*3/uL (ref 4.0–10.5)

## 2014-11-13 LAB — POCT URINALYSIS DIP (DEVICE)
Bilirubin Urine: NEGATIVE
GLUCOSE, UA: NEGATIVE mg/dL
Ketones, ur: NEGATIVE mg/dL
Leukocytes, UA: NEGATIVE
Nitrite: NEGATIVE
PROTEIN: NEGATIVE mg/dL
Specific Gravity, Urine: 1.02 (ref 1.005–1.030)
UROBILINOGEN UA: 0.2 mg/dL (ref 0.0–1.0)
pH: 6 (ref 5.0–8.0)

## 2014-11-13 MED ORDER — TETANUS-DIPHTH-ACELL PERTUSSIS 5-2.5-18.5 LF-MCG/0.5 IM SUSP
0.5000 mL | Freq: Once | INTRAMUSCULAR | Status: AC
  Administered 2014-11-13: 0.5 mL via INTRAMUSCULAR

## 2014-11-13 NOTE — Progress Notes (Signed)
27 weeks.  I have seen and evaluated the patient with the NP student, Micker Samios. I agree with the assessment and plan as written above.   Bertram DenverKaren E Teague Clark, PA-C  11/13/2014 12:02 PM

## 2014-11-13 NOTE — Progress Notes (Signed)
28 wk labs with 1 hour glucose Tdap vaccine, 17 P  17P reordered.

## 2014-11-13 NOTE — Patient Instructions (Signed)
Second Trimester of Pregnancy The second trimester is from week 13 through week 28, months 4 through 6. The second trimester is often a time when you feel your best. Your body has also adjusted to being pregnant, and you begin to feel better physically. Usually, morning sickness has lessened or quit completely, you may have more energy, and you may have an increase in appetite. The second trimester is also a time when the fetus is growing rapidly. At the end of the sixth month, the fetus is about 9 inches long and weighs about 1 pounds. You will likely begin to feel the baby move (quickening) between 18 and 20 weeks of the pregnancy. BODY CHANGES Your body goes through many changes during pregnancy. The changes vary from woman to woman.   Your weight will continue to increase. You will notice your lower abdomen bulging out.  You may begin to get stretch marks on your hips, abdomen, and breasts.  You may develop headaches that can be relieved by medicines approved by your health care provider.  You may urinate more often because the fetus is pressing on your bladder.  You may develop or continue to have heartburn as a result of your pregnancy.  You may develop constipation because certain hormones are causing the muscles that push waste through your intestines to slow down.  You may develop hemorrhoids or swollen, bulging veins (varicose veins).  You may have back pain because of the weight gain and pregnancy hormones relaxing your joints between the bones in your pelvis and as a result of a shift in weight and the muscles that support your balance.  Your breasts will continue to grow and be tender.  Your gums may bleed and may be sensitive to brushing and flossing.  Dark spots or blotches (chloasma, mask of pregnancy) may develop on your face. This will likely fade after the baby is born.  A dark line from your belly button to the pubic area (linea nigra) may appear. This will likely fade  after the baby is born.  You may have changes in your hair. These can include thickening of your hair, rapid growth, and changes in texture. Some women also have hair loss during or after pregnancy, or hair that feels dry or thin. Your hair will most likely return to normal after your baby is born. WHAT TO EXPECT AT YOUR PRENATAL VISITS During a routine prenatal visit:  You will be weighed to make sure you and the fetus are growing normally.  Your blood pressure will be taken.  Your abdomen will be measured to track your baby's growth.  The fetal heartbeat will be listened to.  Any test results from the previous visit will be discussed. Your health care provider may ask you:  How you are feeling.  If you are feeling the baby move.  If you have had any abnormal symptoms, such as leaking fluid, bleeding, severe headaches, or abdominal cramping.  If you have any questions. Other tests that may be performed during your second trimester include:  Blood tests that check for:  Low iron levels (anemia).  Gestational diabetes (between 24 and 28 weeks).  Rh antibodies.  Urine tests to check for infections, diabetes, or protein in the urine.  An ultrasound to confirm the proper growth and development of the baby.  An amniocentesis to check for possible genetic problems.  Fetal screens for spina bifida and Down syndrome. HOME CARE INSTRUCTIONS   Avoid all smoking, herbs, alcohol, and unprescribed   drugs. These chemicals affect the formation and growth of the baby.  Follow your health care provider's instructions regarding medicine use. There are medicines that are either safe or unsafe to take during pregnancy.  Exercise only as directed by your health care provider. Experiencing uterine cramps is a good sign to stop exercising.  Continue to eat regular, healthy meals.  Wear a good support bra for breast tenderness.  Do not use hot tubs, steam rooms, or saunas.  Wear your  seat belt at all times when driving.  Avoid raw meat, uncooked cheese, cat litter boxes, and soil used by cats. These carry germs that can cause birth defects in the baby.  Take your prenatal vitamins.  Try taking a stool softener (if your health care provider approves) if you develop constipation. Eat more high-fiber foods, such as fresh vegetables or fruit and whole grains. Drink plenty of fluids to keep your urine clear or pale yellow.  Take warm sitz baths to soothe any pain or discomfort caused by hemorrhoids. Use hemorrhoid cream if your health care provider approves.  If you develop varicose veins, wear support hose. Elevate your feet for 15 minutes, 3-4 times a day. Limit salt in your diet.  Avoid heavy lifting, wear low heel shoes, and practice good posture.  Rest with your legs elevated if you have leg cramps or low back pain.  Visit your dentist if you have not gone yet during your pregnancy. Use a soft toothbrush to brush your teeth and be gentle when you floss.  A sexual relationship may be continued unless your health care provider directs you otherwise.  Continue to go to all your prenatal visits as directed by your health care provider. SEEK MEDICAL CARE IF:   You have dizziness.  You have mild pelvic cramps, pelvic pressure, or nagging pain in the abdominal area.  You have persistent nausea, vomiting, or diarrhea.  You have a bad smelling vaginal discharge.  You have pain with urination. SEEK IMMEDIATE MEDICAL CARE IF:   You have a fever.  You are leaking fluid from your vagina.  You have spotting or bleeding from your vagina.  You have severe abdominal cramping or pain.  You have rapid weight gain or loss.  You have shortness of breath with chest pain.  You notice sudden or extreme swelling of your face, hands, ankles, feet, or legs.  You have not felt your baby move in over an hour.  You have severe headaches that do not go away with  medicine.  You have vision changes. Document Released: 10/06/2001 Document Revised: 10/17/2013 Document Reviewed: 12/13/2012 [employer] Patient Information 2015 [employer]. This information is not intended to replace advice given to you by your health care provider. Make sure you discuss any questions you have with your health care provider.  

## 2014-11-13 NOTE — Progress Notes (Signed)
Pt denies complaints at this time. Pt reports adequate family support, with active involvement by FOB and family. Pt reports intention not to circumcise, requests Mirena as contraception s/p delivery. Pt continues to receive weekly 17P injections without problems. Pt to receive Tdap today along with labs and 1h OGTT. Denies vaginal bleeding, LOF, dysuria, contractions or abd pain. Pt reports + fetal movement.

## 2014-11-14 LAB — RPR

## 2014-11-14 LAB — GLUCOSE TOLERANCE, 1 HOUR (50G) W/O FASTING: Glucose, 1 Hour GTT: 111 mg/dL (ref 70–140)

## 2014-11-14 LAB — HIV ANTIBODY (ROUTINE TESTING W REFLEX): HIV: NONREACTIVE

## 2014-11-21 ENCOUNTER — Ambulatory Visit (INDEPENDENT_AMBULATORY_CARE_PROVIDER_SITE_OTHER): Payer: Self-pay | Admitting: *Deleted

## 2014-11-21 VITALS — BP 120/73 | HR 82 | Wt 224.1 lb

## 2014-11-21 DIAGNOSIS — O09212 Supervision of pregnancy with history of pre-term labor, second trimester: Secondary | ICD-10-CM

## 2014-11-21 DIAGNOSIS — O09892 Supervision of other high risk pregnancies, second trimester: Secondary | ICD-10-CM

## 2014-11-28 ENCOUNTER — Ambulatory Visit: Payer: Self-pay | Admitting: *Deleted

## 2014-11-28 VITALS — BP 110/69 | HR 88 | Temp 98.0°F | Wt 225.1 lb

## 2014-11-28 DIAGNOSIS — O09212 Supervision of pregnancy with history of pre-term labor, second trimester: Principal | ICD-10-CM

## 2014-11-28 DIAGNOSIS — O09892 Supervision of other high risk pregnancies, second trimester: Secondary | ICD-10-CM

## 2014-11-28 NOTE — Progress Notes (Signed)
Pt reports that she feels some pain between her legs when she gets up and down. No bleeding and no vaginal discharge. Patient also reports that baby is moving well. She feels like she is dry vaginally, I advised that she use lubricants to moisturize herself. Patient also repeats bumps on her labia that are not painful. Discussed patient with Dr. Erin FullingHarraway Smith and she felt it is okay for patient to wait to be seen until her next visit unless she develops worsening pain. Patient is agreeable to this.

## 2014-12-05 ENCOUNTER — Ambulatory Visit: Payer: Self-pay

## 2014-12-06 ENCOUNTER — Ambulatory Visit (INDEPENDENT_AMBULATORY_CARE_PROVIDER_SITE_OTHER): Payer: Self-pay

## 2014-12-06 VITALS — BP 108/65 | HR 83 | Wt 224.8 lb

## 2014-12-06 DIAGNOSIS — O09213 Supervision of pregnancy with history of pre-term labor, third trimester: Secondary | ICD-10-CM

## 2014-12-06 DIAGNOSIS — O09893 Supervision of other high risk pregnancies, third trimester: Secondary | ICD-10-CM

## 2014-12-11 ENCOUNTER — Encounter: Payer: Self-pay | Admitting: Obstetrics and Gynecology

## 2014-12-12 ENCOUNTER — Ambulatory Visit (INDEPENDENT_AMBULATORY_CARE_PROVIDER_SITE_OTHER): Payer: Self-pay | Admitting: *Deleted

## 2014-12-12 VITALS — BP 102/53 | HR 66 | Temp 98.1°F

## 2014-12-12 DIAGNOSIS — O09893 Supervision of other high risk pregnancies, third trimester: Secondary | ICD-10-CM

## 2014-12-12 DIAGNOSIS — O09213 Supervision of pregnancy with history of pre-term labor, third trimester: Secondary | ICD-10-CM

## 2014-12-12 NOTE — Progress Notes (Signed)
New Makena vial ordered, pt needs to contact the pharmacy in order for the order to be processed.  If the patient does not contact the pharmacy the order will be cancelled.

## 2014-12-18 ENCOUNTER — Telehealth: Payer: Self-pay

## 2014-12-18 ENCOUNTER — Ambulatory Visit (INDEPENDENT_AMBULATORY_CARE_PROVIDER_SITE_OTHER): Payer: Self-pay | Admitting: Advanced Practice Midwife

## 2014-12-18 ENCOUNTER — Encounter: Payer: Self-pay | Admitting: Advanced Practice Midwife

## 2014-12-18 VITALS — BP 120/74 | HR 71 | Temp 98.5°F | Wt 227.4 lb

## 2014-12-18 DIAGNOSIS — O09213 Supervision of pregnancy with history of pre-term labor, third trimester: Secondary | ICD-10-CM

## 2014-12-18 DIAGNOSIS — R238 Other skin changes: Secondary | ICD-10-CM

## 2014-12-18 DIAGNOSIS — O09893 Supervision of other high risk pregnancies, third trimester: Secondary | ICD-10-CM

## 2014-12-18 DIAGNOSIS — L989 Disorder of the skin and subcutaneous tissue, unspecified: Secondary | ICD-10-CM

## 2014-12-18 DIAGNOSIS — N898 Other specified noninflammatory disorders of vagina: Secondary | ICD-10-CM

## 2014-12-18 LAB — POCT URINALYSIS DIP (DEVICE)
BILIRUBIN URINE: NEGATIVE
Glucose, UA: NEGATIVE mg/dL
Hgb urine dipstick: NEGATIVE
KETONES UR: NEGATIVE mg/dL
Leukocytes, UA: NEGATIVE
NITRITE: NEGATIVE
PH: 6 (ref 5.0–8.0)
Protein, ur: NEGATIVE mg/dL
SPECIFIC GRAVITY, URINE: 1.02 (ref 1.005–1.030)
Urobilinogen, UA: 0.2 mg/dL (ref 0.0–1.0)

## 2014-12-18 NOTE — Telephone Encounter (Signed)
Received a call from Joanne Mccann, tech with [facility]-- requested a call at [phone]. Called number and was told they were trying to schedule delivery for Joanne Mccann. Spoke to Joanne Mccann who stated Joanne Mccann will be delivered on 12/20/14. No further questions.

## 2014-12-18 NOTE — Progress Notes (Signed)
Edema- feet  Pt reports having pain and pressure in the vaginal area

## 2014-12-18 NOTE — Progress Notes (Signed)
Patient states she doesn't think she has insurance anymore since she is no longer working. Told patient to call insurance company to verify and to call us back and let us know as she needs refill on makena. Patient verbalized understanding

## 2014-12-18 NOTE — Progress Notes (Signed)
Doing well. C/O lesions on vulva/ "bumps".  Not painful. There are two papules, one on each labia.  Approximately 1-232mm. Round, circumscribed, even borders, shiny, purple/red color, shiny. Seen by Dr Adrian BlackwaterStinson. Recommends looking at them postpartum and possibly doing a biopsy then. Wet prep done for c/o discharge that is green and itchy. No discharge seen on exam.

## 2014-12-18 NOTE — Patient Instructions (Signed)
Third Trimester of Pregnancy The third trimester is from week 29 through week 42, months 7 through 9. The third trimester is a time when the fetus is growing rapidly. At the end of the ninth month, the fetus is about 20 inches in length and weighs 6-10 pounds.  BODY CHANGES Your body goes through many changes during pregnancy. The changes vary from woman to woman.   Your weight will continue to increase. You can expect to gain 25-35 pounds (11-16 kg) by the end of the pregnancy.  You may begin to get stretch marks on your hips, abdomen, and breasts.  You may urinate more often because the fetus is moving lower into your pelvis and pressing on your bladder.  You may develop or continue to have heartburn as a result of your pregnancy.  You may develop constipation because certain hormones are causing the muscles that push waste through your intestines to slow down.  You may develop hemorrhoids or swollen, bulging veins (varicose veins).  You may have pelvic pain because of the weight gain and pregnancy hormones relaxing your joints between the bones in your pelvis. Backaches may result from overexertion of the muscles supporting your posture.  You may have changes in your hair. These can include thickening of your hair, rapid growth, and changes in texture. Some women also have hair loss during or after pregnancy, or hair that feels dry or thin. Your hair will most likely return to normal after your baby is born.  Your breasts will continue to grow and be tender. A yellow discharge may leak from your breasts called colostrum.  Your belly button may stick out.  You may feel short of breath because of your expanding uterus.  You may notice the fetus "dropping," or moving lower in your abdomen.  You may have a bloody mucus discharge. This usually occurs a few days to a week before labor begins.  Your cervix becomes thin and soft (effaced) near your due date. WHAT TO EXPECT AT YOUR PRENATAL  EXAMS  You will have prenatal exams every 2 weeks until week 36. Then, you will have weekly prenatal exams. During a routine prenatal visit:  You will be weighed to make sure you and the fetus are growing normally.  Your blood pressure is taken.  Your abdomen will be measured to track your baby's growth.  The fetal heartbeat will be listened to.  Any test results from the previous visit will be discussed.  You may have a cervical check near your due date to see if you have effaced. At around 36 weeks, your caregiver will check your cervix. At the same time, your caregiver will also perform a test on the secretions of the vaginal tissue. This test is to determine if a type of bacteria, Group B streptococcus, is present. Your caregiver will explain this further. Your caregiver may ask you:  What your birth plan is.  How you are feeling.  If you are feeling the baby move.  If you have had any abnormal symptoms, such as leaking fluid, bleeding, severe headaches, or abdominal cramping.  If you have any questions. Other tests or screenings that may be performed during your third trimester include:  Blood tests that check for low iron levels (anemia).  Fetal testing to check the health, activity level, and growth of the fetus. Testing is done if you have certain medical conditions or if there are problems during the pregnancy. FALSE LABOR You may feel small, irregular contractions that   eventually go away. These are called Braxton Hicks contractions, or false labor. Contractions may last for hours, days, or even weeks before true labor sets in. If contractions come at regular intervals, intensify, or become painful, it is best to be seen by your caregiver.  SIGNS OF LABOR   Menstrual-like cramps.  Contractions that are 5 minutes apart or less.  Contractions that start on the top of the uterus and spread down to the lower abdomen and back.  A sense of increased pelvic pressure or back  pain.  A watery or bloody mucus discharge that comes from the vagina. If you have any of these signs before the 37th week of pregnancy, call your caregiver right away. You need to go to the hospital to get checked immediately. HOME CARE INSTRUCTIONS   Avoid all smoking, herbs, alcohol, and unprescribed drugs. These chemicals affect the formation and growth of the baby.  Follow your caregiver's instructions regarding medicine use. There are medicines that are either safe or unsafe to take during pregnancy.  Exercise only as directed by your caregiver. Experiencing uterine cramps is a good sign to stop exercising.  Continue to eat regular, healthy meals.  Wear a good support bra for breast tenderness.  Do not use hot tubs, steam rooms, or saunas.  Wear your seat belt at all times when driving.  Avoid raw meat, uncooked cheese, cat litter boxes, and soil used by cats. These carry germs that can cause birth defects in the baby.  Take your prenatal vitamins.  Try taking a stool softener (if your caregiver approves) if you develop constipation. Eat more high-fiber foods, such as fresh vegetables or fruit and whole grains. Drink plenty of fluids to keep your urine clear or pale yellow.  Take warm sitz baths to soothe any pain or discomfort caused by hemorrhoids. Use hemorrhoid cream if your caregiver approves.  If you develop varicose veins, wear support hose. Elevate your feet for 15 minutes, 3-4 times a day. Limit salt in your diet.  Avoid heavy lifting, wear low heal shoes, and practice good posture.  Rest a lot with your legs elevated if you have leg cramps or low back pain.  Visit your dentist if you have not gone during your pregnancy. Use a soft toothbrush to brush your teeth and be gentle when you floss.  A sexual relationship may be continued unless your caregiver directs you otherwise.  Do not travel far distances unless it is absolutely necessary and only with the approval  of your caregiver.  Take prenatal classes to understand, practice, and ask questions about the labor and delivery.  Make a trial run to the hospital.  Pack your hospital bag.  Prepare the baby's nursery.  Continue to go to all your prenatal visits as directed by your caregiver. SEEK MEDICAL CARE IF:  You are unsure if you are in labor or if your water has broken.  You have dizziness.  You have mild pelvic cramps, pelvic pressure, or nagging pain in your abdominal area.  You have persistent nausea, vomiting, or diarrhea.  You have a bad smelling vaginal discharge.  You have pain with urination. SEEK IMMEDIATE MEDICAL CARE IF:   You have a fever.  You are leaking fluid from your vagina.  You have spotting or bleeding from your vagina.  You have severe abdominal cramping or pain.  You have rapid weight loss or gain.  You have shortness of breath with chest pain.  You notice sudden or extreme swelling   of your face, hands, ankles, feet, or legs.  You have not felt your baby move in over an hour.  You have severe headaches that do not go away with medicine.  You have vision changes. Document Released: 10/06/2001 Document Revised: 10/17/2013 Document Reviewed: 12/13/2012 [employer] Patient Information 2015 [employer]. This information is not intended to replace advice given to you by your health care provider. Make sure you discuss any questions you have with your health care provider.  

## 2014-12-19 LAB — WET PREP, GENITAL
Trich, Wet Prep: NONE SEEN
WBC WET PREP: NONE SEEN
Yeast Wet Prep HPF POC: NONE SEEN

## 2014-12-25 ENCOUNTER — Telehealth: Payer: Self-pay

## 2014-12-25 DIAGNOSIS — N760 Acute vaginitis: Principal | ICD-10-CM

## 2014-12-25 DIAGNOSIS — B9689 Other specified bacterial agents as the cause of diseases classified elsewhere: Secondary | ICD-10-CM

## 2014-12-25 MED ORDER — METRONIDAZOLE 500 MG PO TABS: 500.0000 mg | ORAL_TABLET | Freq: Two times a day (BID) | ORAL | Status: DC

## 2014-12-25 NOTE — Telephone Encounter (Signed)
BV on wet prep. Flagyl 500mg  BID X 7 days e-prescribed per protocol. Called patient and informed her of results and RX at pharmacy. Patient verbalized understanding and gratitude. No questions or concerns.

## 2014-12-26 ENCOUNTER — Ambulatory Visit: Payer: Self-pay

## 2014-12-27 ENCOUNTER — Ambulatory Visit (INDEPENDENT_AMBULATORY_CARE_PROVIDER_SITE_OTHER): Payer: Self-pay | Admitting: *Deleted

## 2014-12-27 VITALS — BP 119/73 | HR 70 | Temp 98.6°F

## 2014-12-27 DIAGNOSIS — O09893 Supervision of other high risk pregnancies, third trimester: Secondary | ICD-10-CM

## 2014-12-27 DIAGNOSIS — O09213 Supervision of pregnancy with history of pre-term labor, third trimester: Secondary | ICD-10-CM

## 2014-12-27 NOTE — Progress Notes (Signed)
17P injection given  

## 2015-01-01 ENCOUNTER — Encounter: Payer: Self-pay | Admitting: Obstetrics and Gynecology

## 2015-01-02 ENCOUNTER — Ambulatory Visit (INDEPENDENT_AMBULATORY_CARE_PROVIDER_SITE_OTHER): Payer: Self-pay | Admitting: Family

## 2015-01-02 VITALS — BP 118/79 | HR 67 | Temp 98.9°F | Wt 232.4 lb

## 2015-01-02 DIAGNOSIS — O09893 Supervision of other high risk pregnancies, third trimester: Secondary | ICD-10-CM

## 2015-01-02 DIAGNOSIS — O09213 Supervision of pregnancy with history of pre-term labor, third trimester: Secondary | ICD-10-CM

## 2015-01-02 DIAGNOSIS — O09293 Supervision of pregnancy with other poor reproductive or obstetric history, third trimester: Secondary | ICD-10-CM

## 2015-01-02 LAB — POCT URINALYSIS DIP (DEVICE)
Glucose, UA: NEGATIVE mg/dL
Hgb urine dipstick: NEGATIVE
KETONES UR: NEGATIVE mg/dL
NITRITE: NEGATIVE
PH: 6 (ref 5.0–8.0)
Protein, ur: NEGATIVE mg/dL
Specific Gravity, Urine: 1.025 (ref 1.005–1.030)
UROBILINOGEN UA: 0.2 mg/dL (ref 0.0–1.0)

## 2015-01-02 NOTE — Progress Notes (Signed)
No questions or concerns.  Taking flagyl for BV.  Doing well.  17-p today.

## 2015-01-09 ENCOUNTER — Encounter: Payer: Self-pay | Admitting: *Deleted

## 2015-01-09 ENCOUNTER — Ambulatory Visit (INDEPENDENT_AMBULATORY_CARE_PROVIDER_SITE_OTHER): Payer: Self-pay | Admitting: *Deleted

## 2015-01-09 VITALS — BP 124/77 | HR 55 | Temp 98.4°F

## 2015-01-09 DIAGNOSIS — O09213 Supervision of pregnancy with history of pre-term labor, third trimester: Secondary | ICD-10-CM

## 2015-01-09 DIAGNOSIS — O09893 Supervision of other high risk pregnancies, third trimester: Secondary | ICD-10-CM

## 2015-01-17 ENCOUNTER — Ambulatory Visit (INDEPENDENT_AMBULATORY_CARE_PROVIDER_SITE_OTHER): Payer: Self-pay | Admitting: Obstetrics & Gynecology

## 2015-01-17 VITALS — BP 130/77 | HR 67 | Wt 234.4 lb

## 2015-01-17 DIAGNOSIS — O0993 Supervision of high risk pregnancy, unspecified, third trimester: Secondary | ICD-10-CM

## 2015-01-17 DIAGNOSIS — O09213 Supervision of pregnancy with history of pre-term labor, third trimester: Secondary | ICD-10-CM

## 2015-01-17 DIAGNOSIS — O09893 Supervision of other high risk pregnancies, third trimester: Secondary | ICD-10-CM

## 2015-01-17 LAB — POCT URINALYSIS DIP (DEVICE)
Glucose, UA: NEGATIVE mg/dL
Hgb urine dipstick: NEGATIVE
Nitrite: NEGATIVE
PH: 6 (ref 5.0–8.0)
PROTEIN: 100 mg/dL — AB
Specific Gravity, Urine: 1.025 (ref 1.005–1.030)
Urobilinogen, UA: 1 mg/dL (ref 0.0–1.0)

## 2015-01-17 LAB — OB RESULTS CONSOLE GC/CHLAMYDIA
Chlamydia: NEGATIVE
GC PROBE AMP, GENITAL: NEGATIVE

## 2015-01-17 LAB — OB RESULTS CONSOLE GBS: GBS: POSITIVE

## 2015-01-17 NOTE — Patient Instructions (Signed)

## 2015-01-17 NOTE — Progress Notes (Signed)
CT GC and GBS today, last 17 p injection

## 2015-01-18 LAB — GC/CHLAMYDIA PROBE AMP
CT Probe RNA: NEGATIVE
GC PROBE AMP APTIMA: NEGATIVE

## 2015-01-19 LAB — CULTURE, BETA STREP (GROUP B ONLY)

## 2015-01-24 ENCOUNTER — Ambulatory Visit (INDEPENDENT_AMBULATORY_CARE_PROVIDER_SITE_OTHER): Payer: Self-pay | Admitting: Physician Assistant

## 2015-01-24 ENCOUNTER — Encounter: Payer: Self-pay | Admitting: Physician Assistant

## 2015-01-24 VITALS — BP 123/77 | HR 80 | Temp 98.3°F | Wt 233.5 lb

## 2015-01-24 DIAGNOSIS — O0993 Supervision of high risk pregnancy, unspecified, third trimester: Secondary | ICD-10-CM

## 2015-01-24 LAB — POCT URINALYSIS DIP (DEVICE)
GLUCOSE, UA: NEGATIVE mg/dL
Hgb urine dipstick: NEGATIVE
LEUKOCYTES UA: NEGATIVE
NITRITE: NEGATIVE
PROTEIN: 100 mg/dL — AB
Specific Gravity, Urine: 1.025 (ref 1.005–1.030)
UROBILINOGEN UA: 0.2 mg/dL (ref 0.0–1.0)
pH: 5.5 (ref 5.0–8.0)

## 2015-01-24 NOTE — Progress Notes (Signed)
C/o edema in hands/ feet - worse in hands when first gets upand hands itch and hard to make fist. c/o feet hurt from the edema.

## 2015-01-24 NOTE — Progress Notes (Signed)
37 weeks, stable complaining of feet and hands swelling with itching.  She also has new onset HA every day this week.  No vision changes.  No epigastric pain.  Endorses good fetal movement.  Denies LOF, VB, dysuria.   Will obtain baseline PIH labs.   RTC with 24 hour urine.

## 2015-01-24 NOTE — Patient Instructions (Signed)
Third Trimester of Pregnancy The third trimester is from week 29 through week 42, months 7 through 9. The third trimester is a time when the fetus is growing rapidly. At the end of the ninth month, the fetus is about 20 inches in length and weighs 6-10 pounds.  BODY CHANGES Your body goes through many changes during pregnancy. The changes vary from woman to woman.   Your weight will continue to increase. You can expect to gain 25-35 pounds (11-16 kg) by the end of the pregnancy.  You may begin to get stretch marks on your hips, abdomen, and breasts.  You may urinate more often because the fetus is moving lower into your pelvis and pressing on your bladder.  You may develop or continue to have heartburn as a result of your pregnancy.  You may develop constipation because certain hormones are causing the muscles that push waste through your intestines to slow down.  You may develop hemorrhoids or swollen, bulging veins (varicose veins).  You may have pelvic pain because of the weight gain and pregnancy hormones relaxing your joints between the bones in your pelvis. Backaches may result from overexertion of the muscles supporting your posture.  You may have changes in your hair. These can include thickening of your hair, rapid growth, and changes in texture. Some women also have hair loss during or after pregnancy, or hair that feels dry or thin. Your hair will most likely return to normal after your baby is born.  Your breasts will continue to grow and be tender. A yellow discharge may leak from your breasts called colostrum.  Your belly button may stick out.  You may feel short of breath because of your expanding uterus.  You may notice the fetus "dropping," or moving lower in your abdomen.  You may have a bloody mucus discharge. This usually occurs a few days to a week before labor begins.  Your cervix becomes thin and soft (effaced) near your due date. WHAT TO EXPECT AT YOUR PRENATAL  EXAMS  You will have prenatal exams every 2 weeks until week 36. Then, you will have weekly prenatal exams. During a routine prenatal visit:  You will be weighed to make sure you and the fetus are growing normally.  Your blood pressure is taken.  Your abdomen will be measured to track your baby's growth.  The fetal heartbeat will be listened to.  Any test results from the previous visit will be discussed.  You may have a cervical check near your due date to see if you have effaced. At around 36 weeks, your caregiver will check your cervix. At the same time, your caregiver will also perform a test on the secretions of the vaginal tissue. This test is to determine if a type of bacteria, Group B streptococcus, is present. Your caregiver will explain this further. Your caregiver may ask you:  What your birth plan is.  How you are feeling.  If you are feeling the baby move.  If you have had any abnormal symptoms, such as leaking fluid, bleeding, severe headaches, or abdominal cramping.  If you have any questions. Other tests or screenings that may be performed during your third trimester include:  Blood tests that check for low iron levels (anemia).  Fetal testing to check the health, activity level, and growth of the fetus. Testing is done if you have certain medical conditions or if there are problems during the pregnancy. FALSE LABOR You may feel small, irregular contractions that   eventually go away. These are called Braxton Hicks contractions, or false labor. Contractions may last for hours, days, or even weeks before true labor sets in. If contractions come at regular intervals, intensify, or become painful, it is best to be seen by your caregiver.  SIGNS OF LABOR   Menstrual-like cramps.  Contractions that are 5 minutes apart or less.  Contractions that start on the top of the uterus and spread down to the lower abdomen and back.  A sense of increased pelvic pressure or back  pain.  A watery or bloody mucus discharge that comes from the vagina. If you have any of these signs before the 37th week of pregnancy, call your caregiver right away. You need to go to the hospital to get checked immediately. HOME CARE INSTRUCTIONS   Avoid all smoking, herbs, alcohol, and unprescribed drugs. These chemicals affect the formation and growth of the baby.  Follow your caregiver's instructions regarding medicine use. There are medicines that are either safe or unsafe to take during pregnancy.  Exercise only as directed by your caregiver. Experiencing uterine cramps is a good sign to stop exercising.  Continue to eat regular, healthy meals.  Wear a good support bra for breast tenderness.  Do not use hot tubs, steam rooms, or saunas.  Wear your seat belt at all times when driving.  Avoid raw meat, uncooked cheese, cat litter boxes, and soil used by cats. These carry germs that can cause birth defects in the baby.  Take your prenatal vitamins.  Try taking a stool softener (if your caregiver approves) if you develop constipation. Eat more high-fiber foods, such as fresh vegetables or fruit and whole grains. Drink plenty of fluids to keep your urine clear or pale yellow.  Take warm sitz baths to soothe any pain or discomfort caused by hemorrhoids. Use hemorrhoid cream if your caregiver approves.  If you develop varicose veins, wear support hose. Elevate your feet for 15 minutes, 3-4 times a day. Limit salt in your diet.  Avoid heavy lifting, wear low heal shoes, and practice good posture.  Rest a lot with your legs elevated if you have leg cramps or low back pain.  Visit your dentist if you have not gone during your pregnancy. Use a soft toothbrush to brush your teeth and be gentle when you floss.  A sexual relationship may be continued unless your caregiver directs you otherwise.  Do not travel far distances unless it is absolutely necessary and only with the approval  of your caregiver.  Take prenatal classes to understand, practice, and ask questions about the labor and delivery.  Make a trial run to the hospital.  Pack your hospital bag.  Prepare the baby's nursery.  Continue to go to all your prenatal visits as directed by your caregiver. SEEK MEDICAL CARE IF:  You are unsure if you are in labor or if your water has broken.  You have dizziness.  You have mild pelvic cramps, pelvic pressure, or nagging pain in your abdominal area.  You have persistent nausea, vomiting, or diarrhea.  You have a bad smelling vaginal discharge.  You have pain with urination. SEEK IMMEDIATE MEDICAL CARE IF:   You have a fever.  You are leaking fluid from your vagina.  You have spotting or bleeding from your vagina.  You have severe abdominal cramping or pain.  You have rapid weight loss or gain.  You have shortness of breath with chest pain.  You notice sudden or extreme swelling   of your face, hands, ankles, feet, or legs.  You have not felt your baby move in over an hour.  You have severe headaches that do not go away with medicine.  You have vision changes. Document Released: 10/06/2001 Document Revised: 10/17/2013 Document Reviewed: 12/13/2012 [employer] Patient Information 2015 [employer]. This information is not intended to replace advice given to you by your health care provider. Make sure you discuss any questions you have with your health care provider.  

## 2015-01-24 NOTE — Progress Notes (Signed)
Ms. Joanne Mccann is a [id number] pt C/o swelling begin 2-3 wks ago, headache in the morning.  Urine protein is elevated and BP is stable.  No LOF, no hematuria, no VB.  Endorses good FM.  Reports sometimes having dysuria. Consulted regarding labor precautions and fetal kick counts. Labs: CBC, CMP, 24 hour protein/creatinine, urine creatinine clearance  Patient interview and exam completed in presence of and conjunction with PA.

## 2015-01-28 ENCOUNTER — Inpatient Hospital Stay (HOSPITAL_COMMUNITY): Payer: Medicaid Other | Admitting: Anesthesiology

## 2015-01-28 ENCOUNTER — Encounter (HOSPITAL_COMMUNITY): Payer: Self-pay | Admitting: *Deleted

## 2015-01-28 ENCOUNTER — Inpatient Hospital Stay (HOSPITAL_COMMUNITY)
Admission: AD | Admit: 2015-01-28 | Discharge: 2015-01-31 | DRG: 774 | Disposition: A | Discharge status: Home / Self Care | Payer: Medicaid Other | Source: Ambulatory Visit | Attending: Obstetrics & Gynecology | Admitting: Obstetrics & Gynecology

## 2015-01-28 DIAGNOSIS — Z3A37 37 weeks gestation of pregnancy: Secondary | ICD-10-CM | POA: Diagnosis present

## 2015-01-28 DIAGNOSIS — Z9049 Acquired absence of other specified parts of digestive tract: Secondary | ICD-10-CM | POA: Diagnosis present

## 2015-01-28 DIAGNOSIS — O99214 Obesity complicating childbirth: Secondary | ICD-10-CM | POA: Diagnosis present

## 2015-01-28 DIAGNOSIS — O1403 Mild to moderate pre-eclampsia, third trimester: Secondary | ICD-10-CM | POA: Diagnosis present

## 2015-01-28 DIAGNOSIS — IMO0001 Reserved for inherently not codable concepts without codable children: Secondary | ICD-10-CM

## 2015-01-28 DIAGNOSIS — O1493 Unspecified pre-eclampsia, third trimester: Secondary | ICD-10-CM

## 2015-01-28 DIAGNOSIS — Z3490 Encounter for supervision of normal pregnancy, unspecified, unspecified trimester: Secondary | ICD-10-CM

## 2015-01-28 DIAGNOSIS — O1490 Unspecified pre-eclampsia, unspecified trimester: Secondary | ICD-10-CM | POA: Diagnosis present

## 2015-01-28 DIAGNOSIS — O99824 Streptococcus B carrier state complicating childbirth: Secondary | ICD-10-CM | POA: Diagnosis present

## 2015-01-28 DIAGNOSIS — Z6841 Body Mass Index (BMI) 40.0 and over, adult: Secondary | ICD-10-CM | POA: Diagnosis not present

## 2015-01-28 LAB — CBC
HCT: 38 % (ref 36.0–46.0)
HCT: 40.3 % (ref 36.0–46.0)
HCT: 38.6 % (ref 36.0–46.0)
HEMOGLOBIN: 14.7 g/dL (ref 12.0–15.0)
HEMOGLOBIN: 13.6 g/dL (ref 12.0–15.0)
Hemoglobin: 13.5 g/dL (ref 12.0–15.0)
MCH: 29.3 pg (ref 26.0–34.0)
MCH: 29.9 pg (ref 26.0–34.0)
MCH: 28.9 pg (ref 26.0–34.0)
MCHC: 35.5 g/dL (ref 30.0–36.0)
MCHC: 36.5 g/dL — ABNORMAL HIGH (ref 30.0–36.0)
MCHC: 35.2 g/dL (ref 30.0–36.0)
MCV: 82.6 fL (ref 78.0–100.0)
MCV: 81.9 fL (ref 78.0–100.0)
MCV: 82 fL (ref 78.0–100.0)
PLATELETS: 255 10*3/uL (ref 150–400)
Platelets: 253 10*3/uL (ref 150–400)
Platelets: 237 10*3/uL (ref 150–400)
RBC: 4.6 MIL/uL (ref 3.87–5.11)
RBC: 4.92 MIL/uL (ref 3.87–5.11)
RBC: 4.71 MIL/uL (ref 3.87–5.11)
RDW: 13.9 % (ref 11.5–15.5)
RDW: 13.8 % (ref 11.5–15.5)
RDW: 13.7 % (ref 11.5–15.5)
WBC: 8.5 10*3/uL (ref 4.0–10.5)
WBC: 9.3 10*3/uL (ref 4.0–10.5)
WBC: 14 10*3/uL — ABNORMAL HIGH (ref 4.0–10.5)

## 2015-01-28 LAB — TYPE AND SCREEN
ABO/RH(D): B POS
ANTIBODY SCREEN: NEGATIVE

## 2015-01-28 LAB — ABO/RH: ABO/RH(D): B POS

## 2015-01-28 LAB — PROTEIN / CREATININE RATIO, URINE
Creatinine, Urine: 130 mg/dL
PROTEIN CREATININE RATIO: 0.68 — AB (ref 0.00–0.15)
Total Protein, Urine: 88 mg/dL

## 2015-01-28 LAB — COMPREHENSIVE METABOLIC PANEL
ALT: 28 U/L (ref 0–35)
AST: 28 U/L (ref 0–37)
Albumin: 2.8 g/dL — ABNORMAL LOW (ref 3.5–5.2)
Alkaline Phosphatase: 288 U/L — ABNORMAL HIGH (ref 39–117)
Anion gap: 8 (ref 5–15)
BILIRUBIN TOTAL: 0.4 mg/dL (ref 0.3–1.2)
BUN: 10 mg/dL (ref 6–23)
CHLORIDE: 106 mmol/L (ref 96–112)
CO2: 20 mmol/L (ref 19–32)
CREATININE: 0.61 mg/dL (ref 0.50–1.10)
Calcium: 8.6 mg/dL (ref 8.4–10.5)
GFR calc Af Amer: >90 mL/min (ref >90)
Glucose, Bld: 78 mg/dL (ref 70–99)
Potassium: 3.9 mmol/L (ref 3.5–5.1)
Sodium: 134 mmol/L — ABNORMAL LOW (ref 135–145)
Total Protein: 6.5 g/dL (ref 6.0–8.3)

## 2015-01-28 LAB — URINE MICROSCOPIC-ADD ON

## 2015-01-28 LAB — URINALYSIS, ROUTINE W REFLEX MICROSCOPIC
Bilirubin Urine: NEGATIVE
GLUCOSE, UA: NEGATIVE mg/dL
Hgb urine dipstick: NEGATIVE
KETONES UR: NEGATIVE mg/dL
LEUKOCYTES UA: NEGATIVE
NITRITE: NEGATIVE
PROTEIN: 30 mg/dL — AB
Specific Gravity, Urine: 1.025 (ref 1.005–1.030)
UROBILINOGEN UA: 0.2 mg/dL (ref 0.0–1.0)
pH: 6 (ref 5.0–8.0)

## 2015-01-28 LAB — URIC ACID: URIC ACID, SERUM: 7.2 mg/dL — AB (ref 2.4–7.0)

## 2015-01-28 LAB — LACTATE DEHYDROGENASE: LDH: 188 U/L (ref 94–250)

## 2015-01-28 MED ORDER — OXYCODONE-ACETAMINOPHEN 5-325 MG PO TABS
1.0000 | ORAL_TABLET | ORAL | Status: DC | PRN
Start: 2015-01-28 — End: 2015-01-29

## 2015-01-28 MED ORDER — EPHEDRINE 5 MG/ML INJ
10.0000 mg | INTRAVENOUS | Status: DC | PRN
  Filled 2015-01-28: qty 2

## 2015-01-28 MED ORDER — FENTANYL 2.5 MCG/ML BUPIVACAINE 1/10 % EPIDURAL INFUSION (WH - ANES)
INTRAMUSCULAR | Status: DC | PRN
  Administered 2015-01-28: 14 mL/h via EPIDURAL

## 2015-01-28 MED ORDER — LACTATED RINGERS IV SOLN: INTRAVENOUS | Status: DC

## 2015-01-28 MED ORDER — TERBUTALINE SULFATE 1 MG/ML IJ SOLN: 0.2500 mg | Freq: Once | INTRAMUSCULAR | Status: AC | PRN

## 2015-01-28 MED ORDER — PHENYLEPHRINE 40 MCG/ML (10ML) SYRINGE FOR IV PUSH (FOR BLOOD PRESSURE SUPPORT)
80.0000 ug | PREFILLED_SYRINGE | INTRAVENOUS | Status: DC | PRN
  Filled 2015-01-28: qty 2

## 2015-01-28 MED ORDER — MAGNESIUM SULFATE BOLUS VIA INFUSION
4.0000 g | Freq: Once | INTRAVENOUS | Status: AC
  Administered 2015-01-28: 4 g via INTRAVENOUS
  Filled 2015-01-28: qty 500

## 2015-01-28 MED ORDER — FENTANYL CITRATE 0.05 MG/ML IJ SOLN
100.0000 ug | INTRAMUSCULAR | Status: DC | PRN
  Administered 2015-01-28 (×2): 100 ug via INTRAVENOUS
  Filled 2015-01-28 (×2): qty 2

## 2015-01-28 MED ORDER — OXYTOCIN 40 UNITS IN LACTATED RINGERS INFUSION - SIMPLE MED: 62.5000 mL/h | INTRAVENOUS | Status: DC

## 2015-01-28 MED ORDER — MAGNESIUM SULFATE 40 G IN LACTATED RINGERS - SIMPLE
2.0000 g/h | INTRAVENOUS | Status: AC
  Administered 2015-01-29: 2 g/h via INTRAVENOUS
  Filled 2015-01-28 (×2): qty 500

## 2015-01-28 MED ORDER — LABETALOL HCL 5 MG/ML IV SOLN: 20.0000 mg | INTRAVENOUS | Status: DC | PRN

## 2015-01-28 MED ORDER — ONDANSETRON HCL 4 MG/2ML IJ SOLN
4.0000 mg | Freq: Four times a day (QID) | INTRAMUSCULAR | Status: DC | PRN
  Administered 2015-01-28 (×2): 4 mg via INTRAVENOUS
  Filled 2015-01-28 (×2): qty 2

## 2015-01-28 MED ORDER — OXYTOCIN BOLUS FROM INFUSION
500.0000 mL | INTRAVENOUS | Status: DC
  Administered 2015-01-28: 500 mL via INTRAVENOUS

## 2015-01-28 MED ORDER — DEXTROSE 5 % IV SOLN
5.0000 10*6.[IU] | Freq: Once | INTRAVENOUS | Status: AC
  Administered 2015-01-28: 5 10*6.[IU] via INTRAVENOUS
  Filled 2015-01-28: qty 5

## 2015-01-28 MED ORDER — DIPHENHYDRAMINE HCL 50 MG/ML IJ SOLN: 12.5000 mg | INTRAMUSCULAR | Status: DC | PRN

## 2015-01-28 MED ORDER — CITRIC ACID-SODIUM CITRATE 334-500 MG/5ML PO SOLN
30.0000 mL | ORAL | Status: DC | PRN
  Filled 2015-01-28: qty 30

## 2015-01-28 MED ORDER — LACTATED RINGERS IV SOLN
500.0000 mL | Freq: Once | INTRAVENOUS | Status: AC
  Administered 2015-01-28: 500 mL via INTRAVENOUS

## 2015-01-28 MED ORDER — LACTATED RINGERS IV SOLN: 500.0000 mL | INTRAVENOUS | Status: DC | PRN

## 2015-01-28 MED ORDER — ACETAMINOPHEN 325 MG PO TABS
650.0000 mg | ORAL_TABLET | ORAL | Status: DC | PRN
Start: 2015-01-28 — End: 2015-01-29
  Administered 2015-01-28 (×2): 650 mg via ORAL
  Filled 2015-01-28 (×2): qty 2

## 2015-01-28 MED ORDER — PHENYLEPHRINE 40 MCG/ML (10ML) SYRINGE FOR IV PUSH (FOR BLOOD PRESSURE SUPPORT)
80.0000 ug | PREFILLED_SYRINGE | INTRAVENOUS | Status: DC | PRN
  Filled 2015-01-28: qty 20
  Filled 2015-01-28: qty 2

## 2015-01-28 MED ORDER — LIDOCAINE HCL (PF) 1 % IJ SOLN
30.0000 mL | INTRAMUSCULAR | Status: DC | PRN
  Filled 2015-01-28: qty 30

## 2015-01-28 MED ORDER — FENTANYL 2.5 MCG/ML BUPIVACAINE 1/10 % EPIDURAL INFUSION (WH - ANES)
14.0000 mL/h | INTRAMUSCULAR | Status: DC | PRN
  Administered 2015-01-28: 14 mL/h via EPIDURAL
  Filled 2015-01-28: qty 125

## 2015-01-28 MED ORDER — LIDOCAINE HCL (PF) 1 % IJ SOLN
INTRAMUSCULAR | Status: DC | PRN
  Administered 2015-01-28 (×2): 8 mL

## 2015-01-28 MED ORDER — FLEET ENEMA 7-19 GM/118ML RE ENEM: 1.0000 | ENEMA | RECTAL | Status: DC | PRN

## 2015-01-28 MED ORDER — OXYCODONE-ACETAMINOPHEN 5-325 MG PO TABS
2.0000 | ORAL_TABLET | ORAL | Status: DC | PRN
Start: 2015-01-28 — End: 2015-01-29

## 2015-01-28 MED ORDER — PENICILLIN G POTASSIUM 5000000 UNITS IJ SOLR
2.5000 10*6.[IU] | INTRAVENOUS | Status: DC
  Administered 2015-01-28: 2.5 10*6.[IU] via INTRAVENOUS
  Filled 2015-01-28 (×7): qty 2.5

## 2015-01-28 MED ORDER — OXYTOCIN 40 UNITS IN LACTATED RINGERS INFUSION - SIMPLE MED
1.0000 m[IU]/min | INTRAVENOUS | Status: DC
  Administered 2015-01-28: 2 m[IU]/min via INTRAVENOUS
  Filled 2015-01-28: qty 1000

## 2015-01-28 MED ORDER — HYDRALAZINE HCL 20 MG/ML IJ SOLN
5.0000 mg | INTRAMUSCULAR | Status: AC | PRN
  Administered 2015-01-28 (×2): 5 mg via INTRAVENOUS
  Filled 2015-01-28 (×2): qty 1

## 2015-01-28 NOTE — H&P (Signed)
HPI: Joanne Mccann is a 24 y.o. year old G82P1112 female at [redacted]w[redacted]d weeks gestation by 10 week [place] who presents to MAU reporting Labor. Has also developed new occupital HA, proteinuria and significant pedal edema over the part two weeks. Blood pressure has risen over that time, but has been <140's/90's. HRC for Hx PTB. On 17-P. No Hx HTN.   Cervix changed from 3 to 4 cm in MAU. BP elevated 140-160's/90-100's. HA continues. 8/10  On pain scale. Has not taken anything for it. Few severe-range. P:C ratio 0.68.     Clinic  HR Clinic Prenatal Labs  Dating  LMP consistent with 10 wk ultrasound (need records) Blood type:  B pos  Genetic Screen Quad WNL Antibody:Negative (09/21 0000)neg  Anatomic [place] Normal,  Rubella: Immune (09/21 0000)immune  GTT Early:  74        Third trimester: 111 RPR: NON REAC (01/19 1419) NR  TDaP vaccine 11/13/14 HBsAg: Negative (09/21 0000) neg  Flu vaccine  08/23/14 HIV: NONREACTIVE (01/19 1419) Neg  GBS  positive ZOX:WRUEAVWU (03/24 0000)  Contraception  Pap:WNL  Baby Food  Undecided   Circumcision    Pediatrician  [facility]   Support Person  Sister; FOB in immigration services, being deported    Maternal Medical History:  Reason for admission: Contractions.     OB History    Gravida Para Term Preterm AB TAB SAB Ectopic Multiple Living   0 1 0  2     Past Medical History  Diagnosis Date  . Medical history non-contributory    Past Surgical History  Procedure Laterality Date  . Cholecystectomy     Family History: family history is not on file. Social History:  reports that she has never smoked. She has never used smokeless tobacco. She reports that she does not drink alcohol or use illicit drugs.  Prenatal Transfer Tool  Maternal Diabetes: No Genetic Screening: Normal Maternal Ultrasounds/Referrals: Normal Fetal Ultrasounds or other Referrals:  None Maternal Substance Abuse:  No Significant Maternal Medications:   None Significant Maternal Lab Results:  Lab values include: Group B Strep positive Other Comments:  Pre-E, Mag, PCN  Review of Systems  Constitutional: Negative for fever and chills.  HENT: Negative for congestion.   Eyes: Positive for photophobia. Negative for blurred vision, double vision and pain.  Cardiovascular: Positive for leg swelling.  Gastrointestinal: Positive for abdominal pain. Negative for vomiting.  Genitourinary:       Neg for VB, LOF  Neurological: Positive for headaches. Negative for seizures.    Dilation: 3.5 Effacement (%): 70 Station: Ballotable Exam by:: A.Davis,RN Blood pressure 154/99, pulse 67, temperature 97.9 F (36.6 C), temperature source Oral, resp. rate 18, height  (1.575 m), weight 233 lb (105.688 kg), last menstrual period 05/08/2014, SpO2 98 %. Maternal Exam:  Uterine Assessment: Contraction strength is moderate.  Contraction frequency is regular.   Abdomen: Patient reports no abdominal tenderness. Fetal presentation: vertex  Introitus: Normal vulva. Pelvis: adequate for delivery.   Cervix: Cervix evaluated by digital exam.     Fetal Exam Fetal Monitor Review: Mode: ultrasound.   Baseline rate: 120.  Variability: moderate (6-25 bpm).   Pattern: accelerations present and no decelerations.    Fetal State Assessment: Category I - tracings are normal.     Physical Exam  Nursing note and vitals reviewed. Constitutional: She is oriented to person, place, and time. She appears well-developed and well-nourished. She appears distressed.  HENT:  Head:  Normocephalic.  Eyes: Conjunctivae are normal.  Cardiovascular: Normal rate, regular rhythm and normal heart sounds.   Respiratory: Effort normal and breath sounds normal.  GI: Soft. There is no tenderness.  Genitourinary: Vagina normal.  Musculoskeletal: Normal range of motion. She exhibits edema. She exhibits no tenderness.  Neurological: She is alert and oriented to person, place, and  time. She has normal reflexes.  Skin: Skin is warm and dry.  Psychiatric: She has a normal mood and affect.    Prenatal labs: ABO, Rh: --/--/B POS (04/04 1058) Antibody: NEG (04/04 1058) Rubella: Immune (09/21 0000) RPR: NON REAC (01/19 1419)  HBsAg: Negative (09/21 0000)  HIV: NONREACTIVE (01/19 1419)  GBS: Positive (03/24 0000)  Results for orders placed or performed during the hospital encounter of 01/28/15 (from the past 24 hour(s))  Urinalysis, Routine w reflex microscopic     Status: Abnormal   Collection Time: 01/28/15  7:50 AM  Result Value Ref Range   Color, Urine YELLOW YELLOW   APPearance CLEAR CLEAR   Specific Gravity, Urine 1.025 1.005 - 1.030   pH 6.0 5.0 - 8.0   Glucose, UA NEGATIVE NEGATIVE mg/dL   Hgb urine dipstick NEGATIVE NEGATIVE   Bilirubin Urine NEGATIVE NEGATIVE   Ketones, ur NEGATIVE NEGATIVE mg/dL   Protein, ur 30 (A) NEGATIVE mg/dL   Urobilinogen, UA 0.2 0.0 - 1.0 mg/dL   Nitrite NEGATIVE NEGATIVE   Leukocytes, UA NEGATIVE NEGATIVE  Protein / creatinine ratio, urine     Status: Abnormal   Collection Time: 01/28/15  7:50 AM  Result Value Ref Range   Creatinine, Urine 130.00 mg/dL   Total Protein, Urine 88 mg/dL   Protein Creatinine Ratio 0.68 (H) 0.00 - 0.15  Urine microscopic-add on     Status: Abnormal   Collection Time: 01/28/15  7:50 AM  Result Value Ref Range   Squamous Epithelial / LPF FEW (A) RARE   WBC, UA 0-2 <3 WBC/hpf   Bacteria, UA RARE RARE  Comprehensive metabolic panel     Status: Abnormal   Collection Time: 01/28/15  8:25 AM  Result Value Ref Range   Sodium 134 (L) 135 - 145 mmol/L   Potassium 3.9 3.5 - 5.1 mmol/L   Chloride 106 96 - 112 mmol/L   CO2 20 19 - 32 mmol/L   Glucose, Bld 78 70 - 99 mg/dL   BUN 10 6 - 23 mg/dL   Creatinine, Ser 1.610.61 0.50 - 1.10 mg/dL   Calcium 8.6 8.4 - 09.610.5 mg/dL   Total Protein 6.5 6.0 - 8.3 g/dL   Albumin 2.8 (L) 3.5 - 5.2 g/dL   AST 28 0 - 37 U/L   ALT 28 0 - 35 U/L   Alkaline Phosphatase  288 (H) 39 - 117 U/L   Total Bilirubin 0.4 0.3 - 1.2 mg/dL   GFR calc non Af Amer >90 >90 mL/min   GFR calc Af Amer >90 >90 mL/min   Anion gap 8 5 - 15  Uric acid     Status: Abnormal   Collection Time: 01/28/15  8:25 AM  Result Value Ref Range   Uric Acid, Serum 7.2 (H) 2.4 - 7.0 mg/dL  Lactate dehydrogenase     Status: None   Collection Time: 01/28/15  8:25 AM  Result Value Ref Range   LDH 188 94 - 250 U/L  CBC     Status: None   Collection Time: 01/28/15  8:25 AM  Result Value Ref Range   WBC 8.5 4.0 -  10.5 K/uL   RBC 4.60 3.87 - 5.11 MIL/uL   Hemoglobin 13.5 12.0 - 15.0 g/dL   HCT 16.1 09.6 - 04.5 %   MCV 82.6 78.0 - 100.0 fL   MCH 29.3 26.0 - 34.0 pg   MCHC 35.5 30.0 - 36.0 g/dL   RDW 40.9 81.1 - 91.4 %   Platelets 255 150 - 400 K/uL  Type and screen     Status: None   Collection Time: 01/28/15 10:58 AM  Result Value Ref Range   ABO/RH(D) B POS    Antibody Screen NEG    Sample Expiration 01/31/2015    Assessment: 1. Labor: Early 2. Fetal Wellbeing: Category I  3. Pain Control: None 4. GBS: Pos 5. 37.6 week IUP 6. Pre-eclampsia   Plan:  1. Admit to BS per consult with MD 2. Routine L&D orders 3. Analgesia/anesthesia PRN  4. PCN 5. Mag Sulfate  Riyad Keena 01/28/2015, 1:42 PM

## 2015-01-28 NOTE — Plan of Care (Signed)
Problem: Phase I Progression Outcomes Goal: Assess per MD/Nurse,Routine-VS,FHR,UC,Head to Toe assess Outcome: Completed/Met Date Met:  01/28/15 Pt educated on signs and symptoms or pre-eclampsia. Educated on side effects of Magnesium sulfate.  Pt verbalizes understanding

## 2015-01-28 NOTE — MAU Note (Signed)
Pt presents to MAU with complaints of contractions. Denies any vaginal bleeding or LOF 

## 2015-01-28 NOTE — Anesthesia Procedure Notes (Signed)
Epidural Patient location during procedure: OB Start time: 01/28/2015 6:07 PM End time: 01/28/2015 6:11 PM  Staffing Anesthesiologist: Leilani AbleHATCHETT, Audre Cenci Performed by: anesthesiologist   Preanesthetic Checklist Completed: patient identified, surgical consent, pre-op evaluation, timeout performed, IV checked, risks and benefits discussed and monitors and equipment checked  Epidural Patient position: sitting Prep: site prepped and draped and DuraPrep Patient monitoring: continuous pulse ox and blood pressure Approach: midline Location: L3-L4 Injection technique: LOR air  Needle:  Needle type: Tuohy  Needle gauge: 17 G Needle length: 9 cm and 9 Needle insertion depth: 7 cm Catheter type: closed end flexible Catheter size: 19 Gauge Catheter at skin depth: 12 cm Test dose: negative and Other  Assessment Sensory level: T9 Events: blood not aspirated, injection not painful, no injection resistance, negative IV test and no paresthesia  Additional Notes Reason for block:procedure for pain

## 2015-01-28 NOTE — Anesthesia Preprocedure Evaluation (Signed)
Anesthesia Evaluation  Patient identified by MRN, date of birth, ID band Patient awake    Reviewed: Allergy & Precautions, H&P , NPO status , Patient's Chart, lab work & pertinent test results  Airway Mallampati: I  TM Distance: >3 FB Neck ROM: full    Dental no notable dental hx.    Pulmonary neg pulmonary ROS,    Pulmonary exam normal       Cardiovascular hypertension, Pt. on medications     Neuro/Psych negative neurological ROS  negative psych ROS   GI/Hepatic negative GI ROS, Neg liver ROS,   Endo/Other  Morbid obesity  Renal/GU negative Renal ROS     Musculoskeletal   Abdominal (+) + obese,   Peds  Hematology negative hematology ROS (+)   Anesthesia Other Findings   Reproductive/Obstetrics (+) Pregnancy                             Anesthesia Physical Anesthesia Plan  ASA: III  Anesthesia Plan: Epidural   Post-op Pain Management:    Induction:   Airway Management Planned:   Additional Equipment:   Intra-op Plan:   Post-operative Plan:   Informed Consent: I have reviewed the patients History and Physical, chart, labs and discussed the procedure including the risks, benefits and alternatives for the proposed anesthesia with the patient or authorized representative who has indicated his/her understanding and acceptance.     Plan Discussed with:   Anesthesia Plan Comments:         Anesthesia Quick Evaluation

## 2015-01-28 NOTE — Progress Notes (Signed)
Joanne Mccann is a 24 y.o. [id number] at 6527w6d by LMP admitted for induction of labor due to pre-eclampsia.  Subjective: Patient endorsing headache.  Otherwise voices no complaints.  Patient asking for Fentanyl once more before proceeding with epidural.  Objective: BP 157/98 mmHg  Pulse 64  Temp(Src) 97.6 F (36.4 C) (Oral)  Resp 18  Ht 5\' 2"  (1.575 Mccann)  Wt 233 lb (105.688 kg)  BMI 42.61 kg/m2  SpO2 98%  LMP 05/08/2014   Total I/O In: 1008.6 [P.O.:120; I.V.:638.6; IV Piggyback:250] Out: 150 [Urine:150]  FHT:  FHR: 120 bpm, variability: moderate,  accelerations:  Abscent,  decelerations:  Absent UC:   regular, every 2-3 minutes SVE:   Dilation: 4 Effacement (%): 70 Station: -2 Exam by:: A.Davis, RN / Joanne Mccann  Gen: awake, alert, NAD, lying in bed EENT: EOMI, normal Cardio: RRR. No murmurs Pulm: CTAB, no increased WOB Abd: gravid, NT, +BS GU: as above, bulging bag appreciated EXT: +non-pitting edema b/l LE, +2 DP Neuro: 2/4 patellar DTRs  Labs: Lab Results  Component Value Date   WBC 8.5 01/28/2015   HGB 13.5 01/28/2015   HCT 38.0 01/28/2015   MCV 82.6 01/28/2015   PLT 255 01/28/2015    Assessment / Plan: Induction of labor due to preeclampsia,  progressing well on pitocin  Labor: Progressing on Pitocin, will continue to increase then AROM Preeclampsia:  on magnesium sulfate and no signs or symptoms of toxicity Fetal Wellbeing:  Category I Pain Control:  Fentanyl and will advance to epidural if Fentanyl insufficient I/D:  positive, PCN Anticipated MOD:  NSVD  Joanne Mccann, Joanne M, DO 01/28/2015, 4:54 PM

## 2015-01-29 ENCOUNTER — Encounter (HOSPITAL_COMMUNITY): Payer: Self-pay | Admitting: *Deleted

## 2015-01-29 LAB — CBC
HCT: 36 % (ref 36.0–46.0)
Hemoglobin: 12.6 g/dL (ref 12.0–15.0)
MCH: 28.7 pg (ref 26.0–34.0)
MCHC: 35 g/dL (ref 30.0–36.0)
MCV: 82 fL (ref 78.0–100.0)
Platelets: 222 10*3/uL (ref 150–400)
RBC: 4.39 MIL/uL (ref 3.87–5.11)
RDW: 13.8 % (ref 11.5–15.5)
WBC: 13.3 10*3/uL — AB (ref 4.0–10.5)

## 2015-01-29 LAB — RPR
RPR Ser Ql: NONREACTIVE
RPR Ser Ql: NONREACTIVE

## 2015-01-29 LAB — MRSA PCR SCREENING: MRSA by PCR: NEGATIVE

## 2015-01-29 MED ORDER — ONDANSETRON HCL 4 MG/2ML IJ SOLN: 4.0000 mg | INTRAMUSCULAR | Status: DC | PRN

## 2015-01-29 MED ORDER — SENNOSIDES-DOCUSATE SODIUM 8.6-50 MG PO TABS
2.0000 | ORAL_TABLET | ORAL | Status: DC
  Administered 2015-01-29 – 2015-01-30 (×3): 2 via ORAL
  Filled 2015-01-29 (×3): qty 2

## 2015-01-29 MED ORDER — WITCH HAZEL-GLYCERIN EX PADS: 1.0000 "application " | MEDICATED_PAD | CUTANEOUS | Status: DC | PRN

## 2015-01-29 MED ORDER — OXYCODONE-ACETAMINOPHEN 5-325 MG PO TABS
1.0000 | ORAL_TABLET | ORAL | Status: DC | PRN
  Filled 2015-01-29: qty 1

## 2015-01-29 MED ORDER — TETANUS-DIPHTH-ACELL PERTUSSIS 5-2.5-18.5 LF-MCG/0.5 IM SUSP
0.5000 mL | Freq: Once | INTRAMUSCULAR | Status: DC
  Filled 2015-01-29: qty 0.5

## 2015-01-29 MED ORDER — DIBUCAINE 1 % RE OINT: 1.0000 "application " | TOPICAL_OINTMENT | RECTAL | Status: DC | PRN

## 2015-01-29 MED ORDER — SODIUM CHLORIDE 0.9 % IJ SOLN
3.0000 mL | Freq: Two times a day (BID) | INTRAMUSCULAR | Status: DC
  Administered 2015-01-29 (×2): 3 mL via INTRAVENOUS

## 2015-01-29 MED ORDER — OXYCODONE-ACETAMINOPHEN 5-325 MG PO TABS: 2.0000 | ORAL_TABLET | ORAL | Status: DC | PRN

## 2015-01-29 MED ORDER — IBUPROFEN 600 MG PO TABS
600.0000 mg | ORAL_TABLET | Freq: Four times a day (QID) | ORAL | Status: DC
  Administered 2015-01-29 – 2015-01-31 (×11): 600 mg via ORAL
  Filled 2015-01-29 (×11): qty 1

## 2015-01-29 MED ORDER — PRENATAL MULTIVITAMIN CH
1.0000 | ORAL_TABLET | Freq: Every day | ORAL | Status: DC
Start: 2015-01-29 — End: 2015-01-31
  Administered 2015-01-29 – 2015-01-31 (×2): 1 via ORAL
  Filled 2015-01-29 (×2): qty 1

## 2015-01-29 MED ORDER — ONDANSETRON HCL 4 MG PO TABS
4.0000 mg | ORAL_TABLET | ORAL | Status: DC | PRN
Start: 2015-01-29 — End: 2015-01-31

## 2015-01-29 MED ORDER — DIPHENHYDRAMINE HCL 25 MG PO CAPS: 25.0000 mg | ORAL_CAPSULE | Freq: Four times a day (QID) | ORAL | Status: DC | PRN

## 2015-01-29 MED ORDER — SODIUM CHLORIDE 0.9 % IJ SOLN: 3.0000 mL | INTRAMUSCULAR | Status: DC | PRN

## 2015-01-29 MED ORDER — ZOLPIDEM TARTRATE 5 MG PO TABS: 5.0000 mg | ORAL_TABLET | Freq: Every evening | ORAL | Status: DC | PRN

## 2015-01-29 MED ORDER — BENZOCAINE-MENTHOL 20-0.5 % EX AERO: 1.0000 "application " | INHALATION_SPRAY | CUTANEOUS | Status: DC | PRN

## 2015-01-29 MED ORDER — LANOLIN HYDROUS EX OINT: TOPICAL_OINTMENT | CUTANEOUS | Status: DC | PRN

## 2015-01-29 MED ORDER — LACTATED RINGERS IV SOLN
INTRAVENOUS | Status: DC
  Administered 2015-01-29: 12:00:00 via INTRAVENOUS

## 2015-01-29 MED ORDER — SIMETHICONE 80 MG PO CHEW: 80.0000 mg | CHEWABLE_TABLET | ORAL | Status: DC | PRN

## 2015-01-29 MED ORDER — ACETAMINOPHEN 325 MG PO TABS
650.0000 mg | ORAL_TABLET | ORAL | Status: DC | PRN
Start: 2015-01-29 — End: 2015-01-31

## 2015-01-29 NOTE — Progress Notes (Signed)
UR chart review completed.  

## 2015-01-29 NOTE — Lactation Note (Signed)
This note was copied from the chart of Boy Kamia Cuevas-Araujo. Lactation Consultation Note  Initial visit made.  Mom states baby just finished a feeding.  She feels baby is latching well.  This is her first time breastfeeding.  Reviewed feeding with any cue, use good waking techniques and breast massage during feeding.  Encouraged mom to call with any concern or feeding assist.  LC will follow up this PM.  Patient Name: Boy Iris PertChristian Cuevas-Araujo ZOXWR'UToday's Date: 01/29/2015 Reason for consult: Initial assessment;Infant < 6lbs   Maternal Data    Feeding Feeding Type: Breast Fed Length of feed: 15 min  LATCH Score/Interventions Latch: Repeated attempts needed to sustain latch, nipple held in mouth throughout feeding, stimulation needed to elicit sucking reflex. Intervention(s): Adjust position;Assist with latch;Breast massage;Breast compression  Audible Swallowing: Spontaneous and intermittent Intervention(s): Skin to skin  Type of Nipple: Everted at rest and after stimulation  Comfort (Breast/Nipple): Soft / non-tender     Hold (Positioning): Assistance needed to correctly position infant at breast and maintain latch. Intervention(s): Breastfeeding basics reviewed;Support Pillows;Position options;Skin to skin  LATCH Score: 8  Lactation Tools Discussed/Used     Consult Status Consult Status: Follow-up Date: 01/30/15 Follow-up type: In-patient    Huston FoleyMOULDEN, Chantee Cerino S 01/29/2015, 11:54 AM

## 2015-01-29 NOTE — Progress Notes (Signed)
Post Partum Day 1 Subjective: headache, tired  Objective: Blood pressure 128/72, pulse 83, temperature 97.6 F (36.4 C), temperature source Oral, resp. rate 20, height 5\' 2"  (1.575 m), weight 104.781 kg (231 lb), last menstrual period 05/08/2014, SpO2 99 %, unknown if currently breastfeeding.  Physical Exam:  General: alert, cooperative and no distress Lochia: appropriate Uterine Fundus: firm  DVT Evaluation: No evidence of DVT seen on physical exam.   Recent Labs  01/28/15 1714 01/28/15 2305  HGB 14.7 13.6  HCT 40.3 38.6    Assessment/Plan: Contraception Nexplanon, continue Magnesium total 24 hr   LOS: 1 day   Joanne Mccann 01/29/2015, 6:52 AM

## 2015-01-29 NOTE — Lactation Note (Signed)
This note was copied from the chart of Joanne Mccann. Lactation Consultation Note  Follow up visit made to assess a feeding.  Mom states last feeding 3 hours ago went well.  Assisted with positioning baby in football hold on left side.  Demonstrated manual expression and mom was able to easily express colostrum.  Baby latched easily and deep.  Bottom lip untucked.  Instructed mom on good breast compression and massage to increase milk flow and intake.  Baby nursed actively with good audible swallows for 20+ minutes.  Baby was still feeding well when I left.  Reviewed basics and answered questions.  Encouraged to call for assist/concerns.  Patient Name: Joanne Mccann ZOXWR'UToday's Date: 01/29/2015 Reason for consult: Follow-up assessment;Infant < 6lbs   Maternal Data    Feeding Feeding Type: Breast Fed Length of feed: 20 min  LATCH Score/Interventions Latch: Grasps breast easily, tongue down, lips flanged, rhythmical sucking. Intervention(s): Adjust position;Assist with latch;Breast massage;Breast compression  Audible Swallowing: Spontaneous and intermittent Intervention(s): Hand expression;Alternate breast massage  Type of Nipple: Everted at rest and after stimulation  Comfort (Breast/Nipple): Soft / non-tender     Hold (Positioning): Assistance needed to correctly position infant at breast and maintain latch. Intervention(s): Breastfeeding basics reviewed;Support Pillows;Position options  LATCH Score: 9  Lactation Tools Discussed/Used     Consult Status Consult Status: Follow-up Date: 01/30/15 Follow-up type: In-patient    Huston FoleyMOULDEN, Ilian Wessell S 01/29/2015, 5:34 PM

## 2015-01-29 NOTE — Anesthesia Postprocedure Evaluation (Signed)
  Anesthesia Post-op Note  Patient: Joanne Mccann  Procedure(s) Performed: * No procedures listed *  Patient Location: Mother/Baby  Anesthesia Type:Epidural  Level of Consciousness: awake, alert , oriented and patient cooperative  Airway and Oxygen Therapy: Patient Spontanous Breathing and Patient connected to nasal cannula oxygen  Post-op Pain: mild  Post-op Assessment: Post-op Vital signs reviewed, Patient's Cardiovascular Status Stable, Respiratory Function Stable, Patent Airway, No signs of Nausea or vomiting, Adequate PO intake, Pain level controlled, No headache, No backache, No residual numbness and No residual motor weakness  Post-op Vital Signs: Reviewed and stable  Last Vitals:  Filed Vitals:   01/29/15 1000  BP: 121/73  Pulse: 85  Temp: 36.6 C  Resp: 17    Complications: No apparent anesthesia complications

## 2015-01-30 NOTE — Progress Notes (Signed)
Post Partum Day 2 Subjective: no complaints, up ad lib, voiding, tolerating PO, + flatus and having difficulty with breast feeding. Was given breast pump earlier today, is just learning to use the pump. We will keep til the am, given the late time, and d/c in a.m.  Objective: Blood pressure 131/64, pulse 75, temperature 98.3 F (36.8 C), temperature source Oral, resp. rate 18, height 5\' 2"  (1.575 m), weight 104.781 kg (231 lb), last menstrual period 05/08/2014, SpO2 98 %, unknown if currently breastfeeding.  Physical Exam:  General: alert, cooperative and no distress Lochia: appropriate Uterine Fundus: firm @ u-0 Incision:  DVT Evaluation: No evidence of DVT seen on physical exam. Negative Homan's sign.   Recent Labs  01/28/15 2305 01/29/15 0606  HGB 13.6 12.6  HCT 38.6 36.0    Assessment/Plan: Plan for discharge tomorrow, Breastfeeding and Contraception undecided   LOS: 2 days   Gloriajean Okun V 01/30/2015, 8:31 PM

## 2015-01-30 NOTE — Progress Notes (Signed)
Clinical Social Work Department BRIEF PSYCHOSOCIAL ASSESSMENT 01/30/2015  Patient:  Joanne Mccann,Joanne Mccann     Account Number:  [id number]     Admit date:  01/28/2015  Clinical Social Worker:  Samier Jaco, CLINICAL SOCIAL WORKER  Date/Time:  01/30/2015 11:00 AM  Referred by:  RN  Date Referred:  01/28/2015  Referral:   Concerns about potential deportation of FOB.   Interview type:  Patient  PSYCHOSOCIAL DATA Living Status:  FAMILY- FOB and her children Primary support name:  Regulo Contreras Primary support relationship to patient:  SPOUSE Degree of support available:   MOB stated that she also has her sister and friends who are included in her support system.   CURRENT CONCERNS Current Concerns  None Noted   SOCIAL WORK ASSESSMENT / PLAN CSW received consult and met with MOB due to documentation in prenatal records stating that the FOB was going to be deported.   Upon arrival to room, MOB displayed an approrpriate range in affect and was in a pleasant mood. CSW inquired about visitor who was sleeping in the room, and she stated that he was her "husband".  CSW inquired about his presence since prenatal records stated that he had been deported.  MOB stated that he had been deported, but he returned to the [place] in the middle of March.  She discussed the significant stress and impact of the deportation on herself and her children, but stated that she feels better since he was able to return.  MOB stated that they all continue to adjust to his return, but she stated that everyone is excited and are glad that he made it in time for this infant's arrival.  MOB discussed having the home prepared and feeling supported by her sister and her other friends.  She stated that she is able to return to her job when she ready to return to work. MOB denied mental health history and denied history of postpartum depression. She agreed to contact her medical provider if she notes onset of symptoms.    Assessment/plan status:  No Further Intervention Required/No barriers to discharge Other assessment/ plan:   CSW provided education on postpartum depression.   Information/referral to community resources:   No needs identified at this time.    PATIENT'S/FAMILY'S RESPONSE TO PLAN OF CARE: MOB expressed appreciation for the visit. She stated that she feels "great" and is glad that the FOB/husband was able to return in time for the infant's arrival. She endorsed his absense as a primary stressor during the pregnancy, but reports that all the associated stressors have been resolved.  She agreed to contact CSW during this admission if needs arise, but she stated that she feels supported and prepared for discharge.        

## 2015-01-30 NOTE — Lactation Note (Signed)
This note was copied from the chart of Boy Ellary Cuevas-Araujo. Lactation Consultation Note  Patient Name: Boy Iris PertChristian Cuevas-Araujo WGNFA'OToday's Date: 01/30/2015 Reason for consult: Follow-up assessment   With this mom of a term infant, SGA at now 5 lbs 2 oz. He lost 6.4% at 29 hours of life, and Dr, Ricci BarkerEttifaugh agreed it would be good to begin mom pumping and to supplement baby with alimentum every 3 hours.  I set up DEP and instructed mom in it' sue in premie setting. We also reviewed hand expression. Mom then fed by bottle the 2 mls EBM and 14 mls of formula. Mom will limit breast feeding to 15 minutes for now every 3 hours, and then bottle feed and pump.  [place]  [facility].   Maternal Data    Feeding Feeding Type: Formula Nipple Type: Slow - flow Length of feed: 15 min  LATCH Score/Interventions                      Lactation Tools Discussed/Used WIC Program: Yes [employer]([place] Pump Review: Setup, frequency, and cleaning;Milk Storage;Other (comment) (premie setting ahnd expression) Initiated by:: clee RN LC Date initiated:: 01/30/15   Consult Status Consult Status: Follow-up Date: 01/31/15 Follow-up type: In-patient    Alfred LevinsLee, Winter Jocelyn Anne 01/30/2015, 6:21 PM

## 2015-01-31 MED ORDER — IBUPROFEN 600 MG PO TABS: 600.0000 mg | ORAL_TABLET | Freq: Four times a day (QID) | ORAL | Status: AC

## 2015-01-31 NOTE — Discharge Summary (Signed)
Obstetric Discharge Summary Reason for Admission: onset of labor HPI: Joanne Mccann is a 24 y.o. year old [id number] female at 3734w6d weeks gestation by 10 week US who presents to [facility]U reporting Labor. Has also developed new occupital HA, proteinuria and significant pedal edema over the part two weeks. Blood pressure has risen over that time, but has been <140's/90's. HRC for Hx PTB. On 17-P. No Hx HTN.   Cervix changed from 3 to 4 cm in MAU. BP elevated 140-160's/90-100's. HA continues. 8/10 On pain scale. Has not taken anything for it. Few severe-range. P:C ratio 0.68.  Plan:  1. Admit to BS per consult with MD 2. Routine L&D orders 3. Analgesia/anesthesia PRN 4. PCN 5. Mag Sulfate   Prenatal Procedures: ultrasound Intrapartum Procedures: spontaneous vaginal delivery Postpartum Procedures: magnesium sulfate x 24 hours, with BP normalizing Complications-Operative and Postpartum: none HEMOGLOBIN  Date Value Ref Range Status  01/29/2015 12.6 12.0 - 15.0 g/dL Final  40/98/119109/07/2014 47.813.5 g/dL Final  29/56/213009/07/2014 86.513.5 g/dL Final   HCT  Date Value Ref Range Status  01/29/2015 36.0 36.0 - 46.0 % Final  07/05/2014 37 % Final  07/05/2014 37 % Final    Physical Exam:  BP 114/72 mmHg  Pulse 71  Temp(Src) 97.9 F (36.6 C) (Oral)  Resp 18  Ht 5\' 2"  (1.575 m)  Wt 105.688 kg (233 lb)  BMI 42.61 kg/m2  SpO2 98%  LMP 05/08/2014  Breastfeeding? Unknown  General: alert, cooperative and no distress Lochia: appropriate Uterine Fundus: firm Incision: none DVT Evaluation: No evidence of DVT seen on physical exam.  Discharge Diagnoses: Term Pregnancy-delivered and Preelampsia, resolved  Discharge Information: Date: 01/31/2015 Activity: unrestricted no sexual activity til pp visit Diet: routine Medications: Ibuprofen Condition: stable Instructions: refer to practice specific booklet Discharge to: home Follow-up Information    [facility] In 4 weeks.   Why:  postpartum visit   Contact information:   [address] [id number] [place] [id number] [phone]      [facility]. Schedule an appointment as soon as possible for a visit in 4 weeks.   Why:  postpartum visit, scheduling nexplanon   Contact information:   [address] [id number] [place] [id number] [phone]      Newborn Data: Live born female  Birth Weight: 5 lb 8.5 oz (2510 g) APGAR: 9, 9  Home with mother.  Dyan Creelman V 01/31/2015, 7:33 AM

## 2015-01-31 NOTE — Lactation Note (Signed)
This note was copied from the chart of Joanne Mccann. Lactation Consultation Note  Follow up visit made.  Mom states she pumped this AM and obtained 3 mls.  Baby last ate 3 hours ago and awake and alert.  Mom's breasts are becoming full.  Assisted with positioning baby in football hold.  Baby opens wide and latches easily and deep.  Baby nurses actively and swallows audible.  Instructed mom to continue to supplement baby with 20-30 mls of formula/expressed milk after breastfeeding until milk is fully in.  Also instructed mom post pump for 15 minutes.  [facility] [place] [facility]  Outpatient lactation services and support encouraged.  Patient Name: Joanne Iris PertChristian Mccann ZOXWR'UToday's Date: 01/31/2015 Reason for consult: Follow-up assessment;Infant < 6lbs   Maternal Data    Feeding Feeding Type: Breast Fed Nipple Type: Slow - flow Length of feed: 5 min  LATCH Score/Interventions Latch: Grasps breast easily, tongue down, lips flanged, rhythmical sucking. Intervention(s): Adjust position;Assist with latch;Breast massage;Breast compression  Audible Swallowing: Spontaneous and intermittent  Type of Nipple: Everted at rest and after stimulation  Comfort (Breast/Nipple): Soft / non-tender     Hold (Positioning): No assistance needed to correctly position infant at breast. Intervention(s): Breastfeeding basics reviewed;Support Pillows;Position options  LATCH Score: 10  Lactation Tools Discussed/Used     Consult Status Consult Status: Complete    Huston FoleyMOULDEN, Aaylah Pokorny S 01/31/2015, 10:57 AM

## 2015-01-31 NOTE — Discharge Instructions (Signed)
Etonogestrel implant What is this medicine? ETONOGESTREL (et oh noe JES trel) is a contraceptive (birth control) device. It is used to prevent pregnancy. It can be used for up to 3 years. This medicine may be used for other purposes; ask your health care provider or pharmacist if you have questions. COMMON BRAND NAME(S): Implanon, Nexplanon What should I tell my health care provider before I take this medicine? They need to know if you have any of these conditions: -abnormal vaginal bleeding -blood vessel disease or blood clots -cancer of the breast, cervix, or liver -depression -diabetes -gallbladder disease -headaches -heart disease or recent heart attack -high blood pressure -high cholesterol -kidney disease -liver disease -renal disease -seizures -tobacco smoker -an unusual or allergic reaction to etonogestrel, other hormones, anesthetics or antiseptics, medicines, foods, dyes, or preservatives -pregnant or trying to get pregnant -breast-feeding How should I use this medicine? This device is inserted just under the skin on the inner side of your upper arm by a health care professional. Talk to your pediatrician regarding the use of this medicine in children. Special care may be needed. Overdosage: If you think you've taken too much of this medicine contact a poison control center or emergency room at once. Overdosage: If you think you have taken too much of this medicine contact a poison control center or emergency room at once. NOTE: This medicine is only for you. Do not share this medicine with others. What if I miss a dose? This does not apply. What may interact with this medicine? Do not take this medicine with any of the following medications: -amprenavir -bosentan -fosamprenavir This medicine may also interact with the following medications: -barbiturate medicines for inducing sleep or treating seizures -certain medicines for fungal infections like ketoconazole and  itraconazole -griseofulvin -medicines to treat seizures like carbamazepine, felbamate, oxcarbazepine, phenytoin, topiramate -modafinil -phenylbutazone -rifampin -some medicines to treat HIV infection like atazanavir, indinavir, lopinavir, nelfinavir, tipranavir, ritonavir -St. Syrai Gladwin's wort This list may not describe all possible interactions. Give your health care provider a list of all the medicines, herbs, non-prescription drugs, or dietary supplements you use. Also tell them if you smoke, drink alcohol, or use illegal drugs. Some items may interact with your medicine. What should I watch for while using this medicine? This product does not protect you against HIV infection (AIDS) or other sexually transmitted diseases. You should be able to feel the implant by pressing your fingertips over the skin where it was inserted. Tell your doctor if you cannot feel the implant. What side effects may I notice from receiving this medicine? Side effects that you should report to your doctor or health care professional as soon as possible: -allergic reactions like skin rash, itching or hives, swelling of the face, lips, or tongue -breast lumps -changes in vision -confusion, trouble speaking or understanding -dark urine -depressed mood -general ill feeling or flu-like symptoms -light-colored stools -loss of appetite, nausea -right upper belly pain -severe headaches -severe pain, swelling, or tenderness in the abdomen -shortness of breath, chest pain, swelling in a leg -signs of pregnancy -sudden numbness or weakness of the face, arm or leg -trouble walking, dizziness, loss of balance or coordination -unusual vaginal bleeding, discharge -unusually weak or tired -yellowing of the eyes or skin Side effects that usually do not require medical attention (Report these to your doctor or health care professional if they continue or are bothersome.): -acne -breast pain -changes in  weight -cough -fever or chills -headache -irregular menstrual bleeding -itching, burning, and   vaginal discharge -pain or difficulty passing urine -sore throat This list may not describe all possible side effects. Call your doctor for medical advice about side effects. You may report side effects to FDA at 1-800-FDA-1088. Where should I keep my medicine? This drug is given in a hospital or clinic and will not be stored at home. NOTE: This sheet is a summary. It may not cover all possible information. If you have questions about this medicine, talk to your doctor, pharmacist, or health care provider.  2015, Elsevier/Gold Standard. (2012-04-18 15:37:45)  

## 2015-02-01 ENCOUNTER — Ambulatory Visit: Payer: Self-pay

## 2015-02-01 NOTE — Lactation Note (Addendum)
This note was copied from the chart of Boy Kayani Cuevas-Araujo. Lactation Consultation Note  Patient Name: Boy Iris PertChristian Cuevas-Araujo ZOXWR'UToday's Date: 02/01/2015 Reason for consult: Follow-up assessment  Baby is 2290 hours old and was placed on double photo  tx this am 14.2. Baby has been consistent with feedings( breast or Bottle ) , and milk is in bilaterally .  LC reviewed with mom potential feeding patterns with early term infants and jaundice babies on photo tx. Baby woke up well at 1535 for feeding and LC assisted mom to place baby skin to skin on the right breast , football. Baby opens wide , latches with assist with breast compressions for depth , multiply swallows noted, increased with  breast compressions. Baby fed for 22 mins while LC present, and mom was holding and burping him.  LC recommended options - if breast are to full to start , release breast by hand expressing so areola more compressible for a deeper latch .  Latch skin to skin with firm support and breast compressions. Let the baby feed 15 -20 mins 1st breast and supplement 30 ml after of EBM if available, 20 - 25 ml of formula )  Then pump the other breast. Option #2 - if baby to sluggish to start and it is around 3 hours, may have to give baby an appetizer of EBM, 5-10 ml and then  Latch , if no success latching , finish the total  Supplement and if still hungry latch the baby. Sore nipple and engorgement prevention and tx reviewed . Mom is not engorged at this point, but very full bilaterally , especially outer aspects of both breast. During feeding pointed out to mom the important of breast compressions with latch , during the feeding , and massage the nodules to move the milk.  [facility] RN Delorse Lekhris Galloway aware of the status of moms breast , and to report to 7pto 7a RN to ask mom if she is having issues with over fullness.  Mom already has a DEBP set up and is using it . Mom has a goood under standing of LC plan and her sister is  a great support.   For feeding was being tx with one light under neath baby, LC assisted mom to place baby back in the Bili suit with Double Photo tx, and to supplement with EBM after she pumped.       Maternal Data Has patient been taught Hand Expression?: Yes  Feeding Feeding Type: Breast Fed  LATCH Score/Interventions Latch: Grasps breast easily, tongue down, lips flanged, rhythmical sucking. Intervention(s): Adjust position;Assist with latch;Breast massage;Breast compression  Audible Swallowing: Spontaneous and intermittent  Type of Nipple: Everted at rest and after stimulation  Comfort (Breast/Nipple): Filling, red/small blisters or bruises, mild/mod discomfort  Problem noted: Filling  Hold (Positioning): Assistance needed to correctly position infant at breast and maintain latch. Intervention(s): Breastfeeding basics reviewed;Support Pillows;Position options;Skin to skin  LATCH Score: 8  Lactation Tools Discussed/Used     Consult Status Consult Status: Follow-up Date: 02/02/15 Follow-up type: In-patient    Kathrin Greathouseorio, Anedra Penafiel Ann 02/01/2015, 3:48 PM

## 2015-03-06 ENCOUNTER — Encounter: Payer: Self-pay | Admitting: Obstetrics & Gynecology

## 2015-03-06 ENCOUNTER — Ambulatory Visit (INDEPENDENT_AMBULATORY_CARE_PROVIDER_SITE_OTHER): Payer: Self-pay | Admitting: Obstetrics & Gynecology

## 2015-03-06 DIAGNOSIS — O1410 Severe pre-eclampsia, unspecified trimester: Secondary | ICD-10-CM | POA: Insufficient documentation

## 2015-03-06 HISTORY — DX: Severe pre-eclampsia, unspecified trimester: O14.10

## 2015-03-06 NOTE — Progress Notes (Signed)
    Subjective:     Joanne Mccann is a [age] y.o. female who presents for a postpartum visit. She is 5 weeks postpartum following a spontaneous vaginal delivery.  I have fully reviewed the prenatal and intrapartum course. The delivery was at 5548w6d gestational weeks, complicated by intrapartum severe preeclampsia.  Outcome: spontaneous vaginal delivery. Anesthesia: epidural. Postpartum course has been uncomplicated Baby's course has been uncomplicated. Baby is feeding by breast. Bleeding no bleeding. Bowel function is normal. Bladder function is normal. Patient is not sexually active. Contraception method is abstinence. Postpartum depression screening: negative.  The following portions of the patient's history were reviewed and updated as appropriate: allergies, current medications, past family history, past medical history, past social history, past surgical history and problem list.  Normal pap in 07/16/2014.  Review of Systems Pertinent items are noted in HPI.   Objective:    BP 105/66 mmHg  Pulse 77  Temp(Src) 98.4 F (36.9 C) (Oral)  Wt 218 lb 1.6 oz (98.93 kg)  Breastfeeding? Yes  General:  alert and no distress   Breasts:  inspection negative, no nipple discharge or bleeding, no masses or nodularity palpable  Lungs: clear to auscultation bilaterally  Heart:  regular rate and rhythm  Abdomen: soft, non-tender; bowel sounds normal; no masses,  no organomegaly  Pelvic:  not evaluated        Assessment:   Normal postpartum exam. Pap smear not done at today's visit.   Plan:    1. Contraception: abstinence and [facility]. Condom samples given to patient. 2. History of severe preeclampsia: Normal BP, will need aspirin prophylaxis and surveillance for subsequent pregnancies 3. Follow up as needed.  Routine preventative health maintenance measures emphasized.   Jaynie CollinsUGONNA  Dory Verdun, MD, FACOG Attending Obstetrician & Gynecologist Center  for [facility], [facility]

## 2015-03-06 NOTE — Patient Instructions (Signed)
Return to clinic for any scheduled appointments or for any gynecologic concerns as needed.   

## 2015-07-25 IMAGING — US US OB COMP +14 WK
2 series · 12 of 28 positions shown · non-contrast
Comparison: none

[Series 1: us ob comp +14 wk mfm · 10 of 70 slices shown (1 of 2)]
[im 4/70]
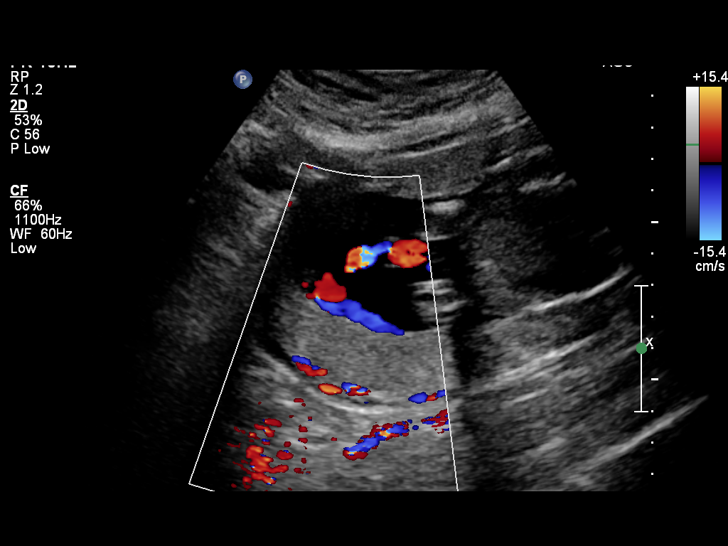
[im 10/70]
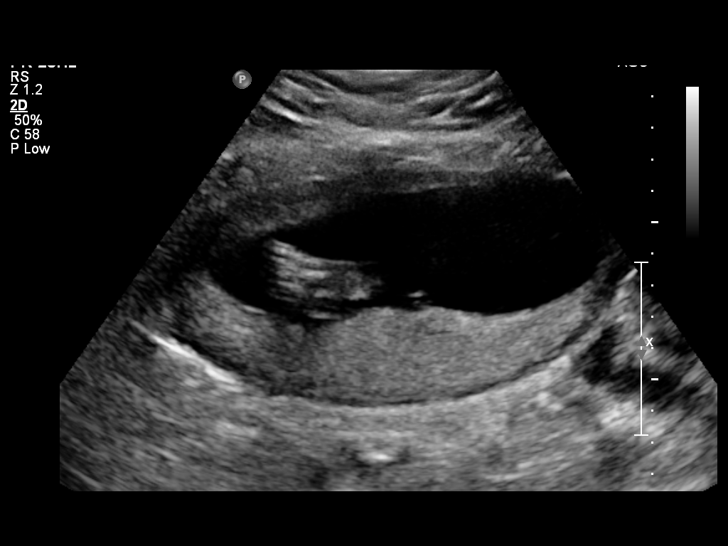
[im 16/70]
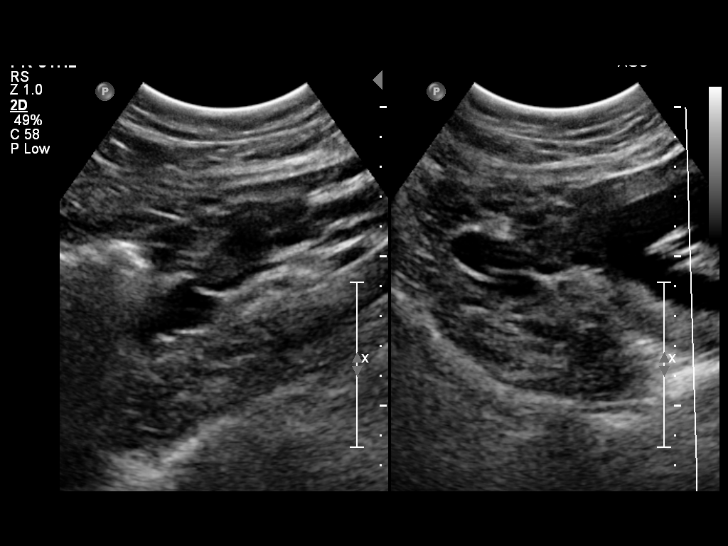
[im 25/70]
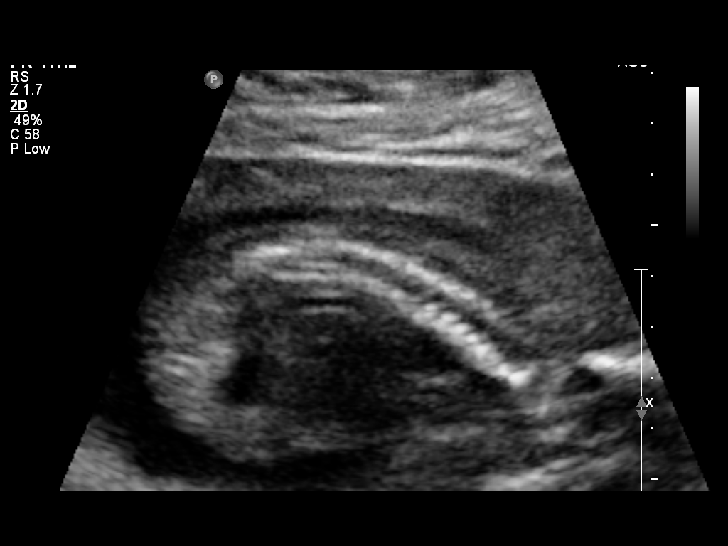
[im 31/70]
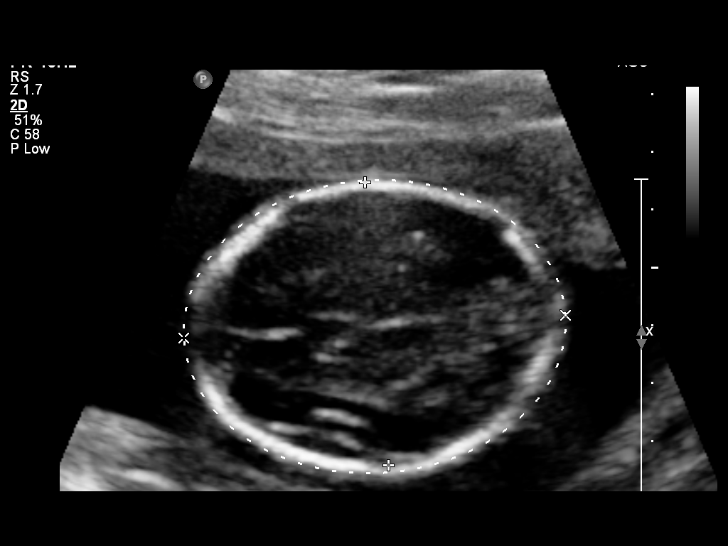
[im 37/70]
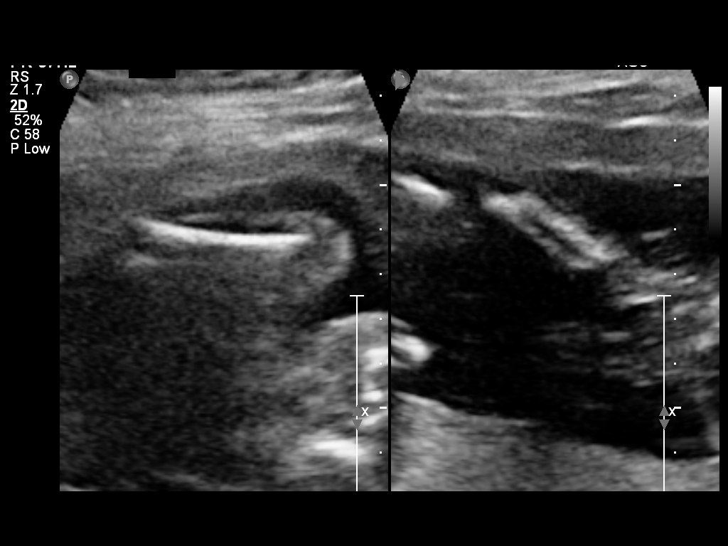
[im 46/70]
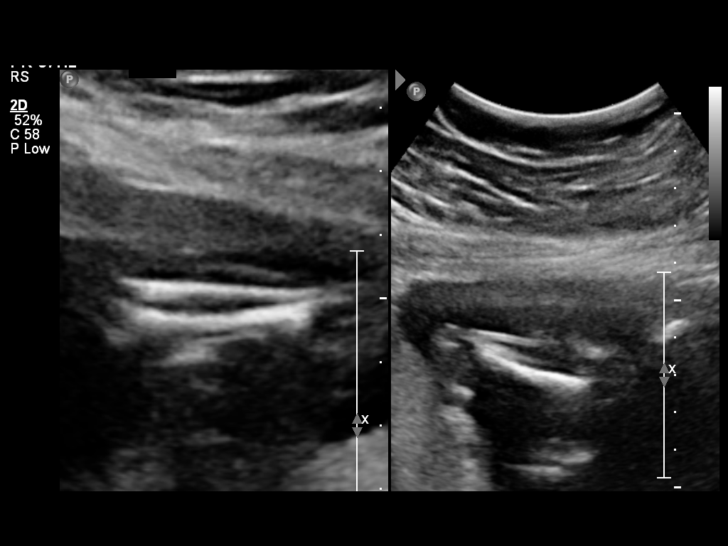
[im 52/70]
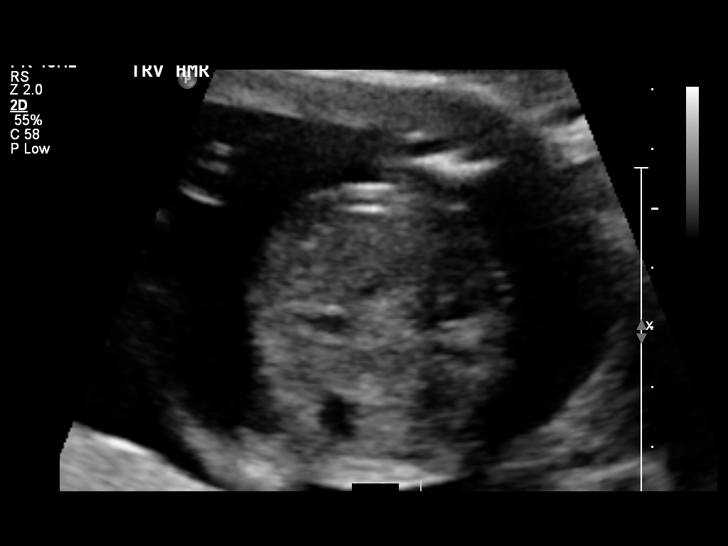
[im 58/70]
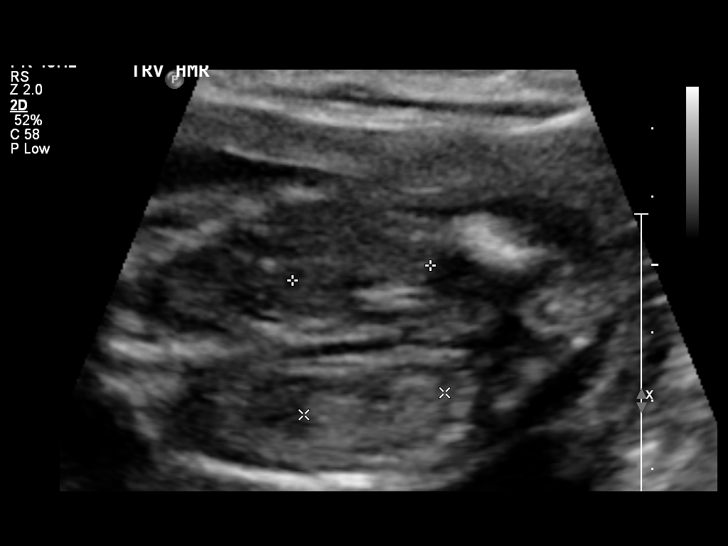
[im 67/70]
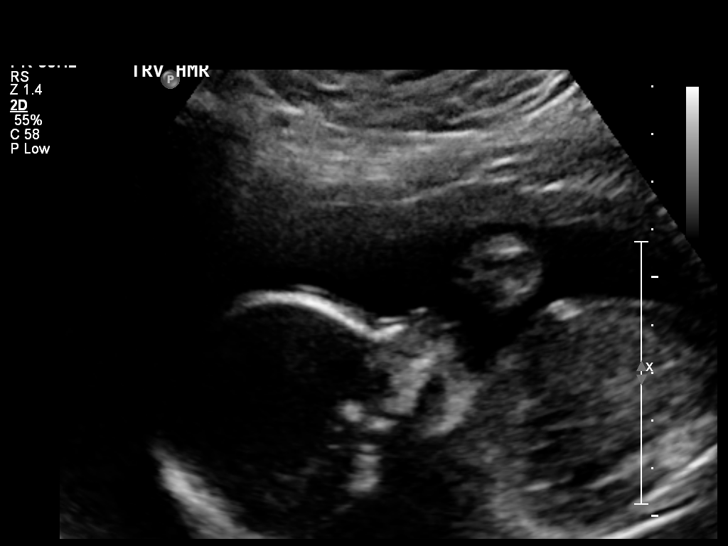

[Series 1: us ob comp +14 wk mfm · 2 of 10 slices shown (2 of 2)]
[im 1/10]
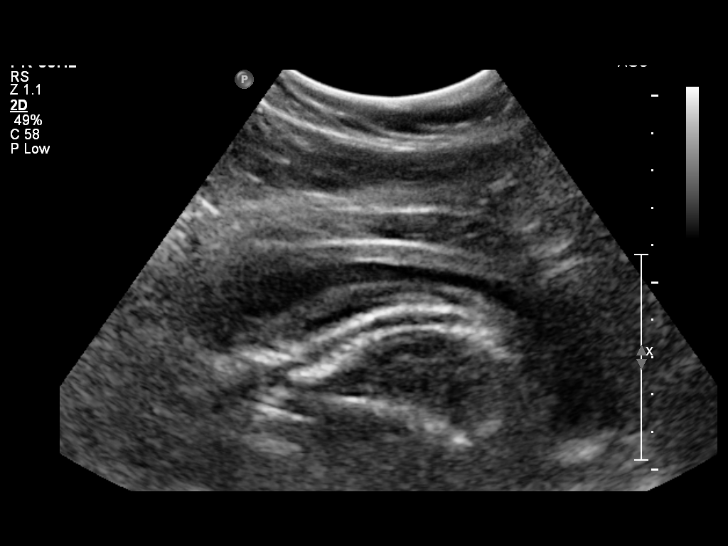
[im 7/10]
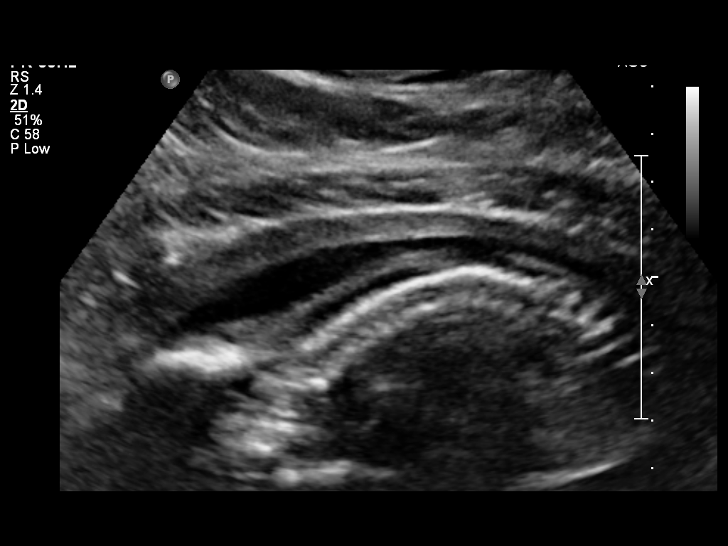

[12 of 28 positions shown; findings below may reference images not displayed]

OBSTETRICS REPORT
                      (Signed Final 09/24/2014 [DATE])

             RUDI

Service(s) Provided

 US OB COMP + 14 WK                                    76805.1
Indications

 Basic anatomic survey                                 z36
 Poor obstetric history: Previous preterm delivery -
 35 weeks (PPROM; IOL)
 19 weeks gestation of pregnancy
Fetal Evaluation

 Num Of Fetuses:    1
 Fetal Heart Rate:  139                          bpm
 Cardiac Activity:  Observed
 Presentation:      Variable
 Placenta:          Posterior, above cervical
                    os
 P. Cord            Marginal insertion
 Insertion:

 Amniotic Fluid
 AFI FV:      Subjectively within normal limits
                                             Larg Pckt:    4.59  cm
Biometry

 BPD:       49  mm     G. Age:  20w 6d                CI:        71.98   70 - 86
                                                      FL/HC:      17.4   16.8 -

 HC:     183.8  mm     G. Age:  20w 5d       80  %    HC/AC:      1.23   1.09 -

 AC:     149.9  mm     G. Age:  20w 1d       57  %    FL/BPD:
 FL:        32  mm     G. Age:  20w 0d       46  %    FL/AC:      21.3   20 - 24
 HUM:     32.9  mm     G. Age:  21w 0d       86  %
 CER:       20  mm     G. Age:  19w 0d       32  %
 NFT:     4.34  mm

 Est. FW:     338  gm    0 lb 12 oz      54  %
Gestational Age

 LMP:           24w 2d        Date:  04/07/14                 EDD:   01/12/15
 U/S Today:     20w 3d                                        EDD:   02/08/15
 Best:          19w 6d     Det. By:  Early Ultrasound         EDD:   02/12/15
                                     (07/17/14)
Anatomy

 Cranium:          Appears normal         Aortic Arch:      Not well visualized
 Fetal Cavum:      Appears normal         Ductal Arch:      Not well visualized
 Ventricles:       Appears normal         Diaphragm:        Appears normal
 Choroid Plexus:   Appears normal         Abdomen:          Appears normal
 Cerebellum:       Appears normal         Abdominal Wall:   Appears nml (cord
                                                            insert, abd wall)
 Posterior Fossa:  Appears normal         Cord Vessels:     Appears normal (3
                                                            vessel cord)
 Nuchal Fold:      Appears normal         Kidneys:          Appear normal
 Face:             Appears normal         Bladder:          Appears normal
                   (orbits and profile)
 Lips:             Appears normal         Spine:            Appears normal
 Heart:            Appears normal         Lower             Appears normal
                   (4CH, axis, and        Extremities:
                   situs)
 RVOT:             Not well visualized    Upper             Appears normal
                                          Extremities:
 LVOT:             Not well visualized

 Other:  Fetus appears to be a male. Nasal bone visualized. Left 5th digit
         visualized. Right heel visualized. Technically difficult due to maternal
         habitus and fetal position.
Targeted Anatomy

 Fetal Central Nervous System
 Cisterna Magna:
Cervix Uterus Adnexa

 Cervical Length:    3.87     cm

 Cervix:       Normal appearance by transabdominal scan.
 Uterus:       No abnormality visualized.
 Cul De Sac:   No free fluid seen.

 Left Ovary:    Within normal limits.
 Right Ovary:   Within normal limits.
 Adnexa:     No abnormality visualized.
Impression

 SIUP at 19+6 weeks
 Normal detailed fetal anatomy; limited views of heart
 Markers of aneuploidy: none
 Normal amniotic fluid volume
 Measurements consistent with prior US
Recommendations

 Follow-up ultrasound in 4-6 weeks to complete anatomy
 survey

 LA MARIPOSITA with us.  Please do not hesitate to contact

## 2015-08-24 IMAGING — US US OB FOLLOW-UP
1 series · 12 of 28 positions shown · non-contrast
Comparison: none

[Series 1: us ob follow-up · 0.20mm/px · 93 acquisitions, 12 frames shown]
[im 4/93]
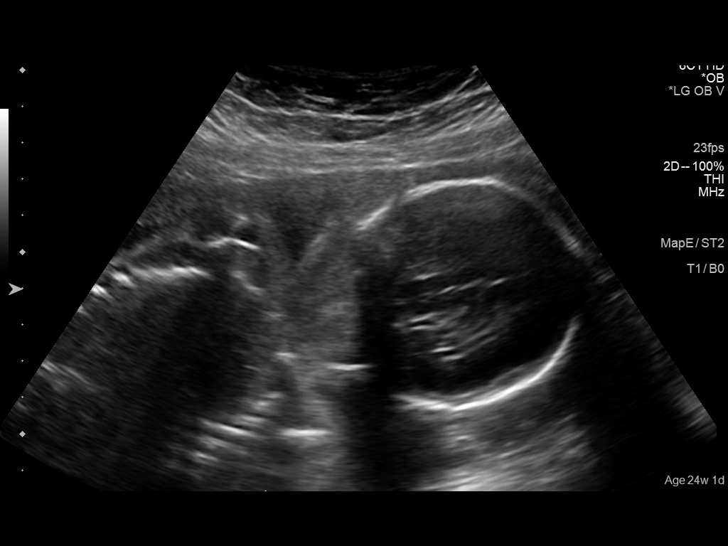
[im 11/93]
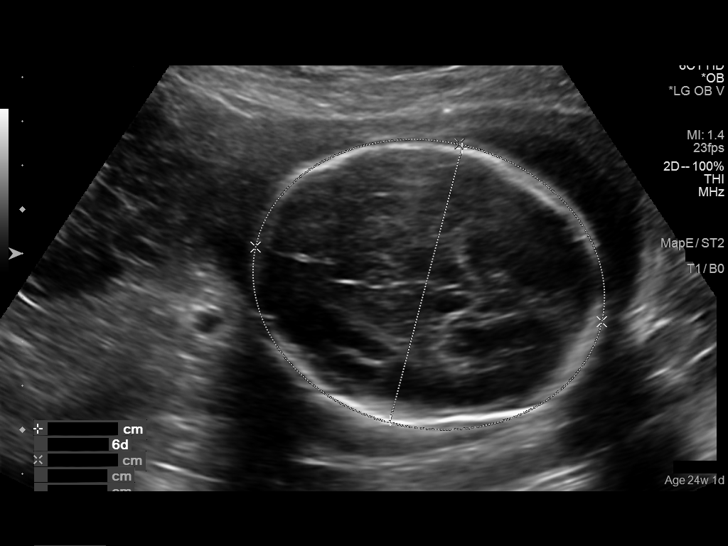
[im 18/93]
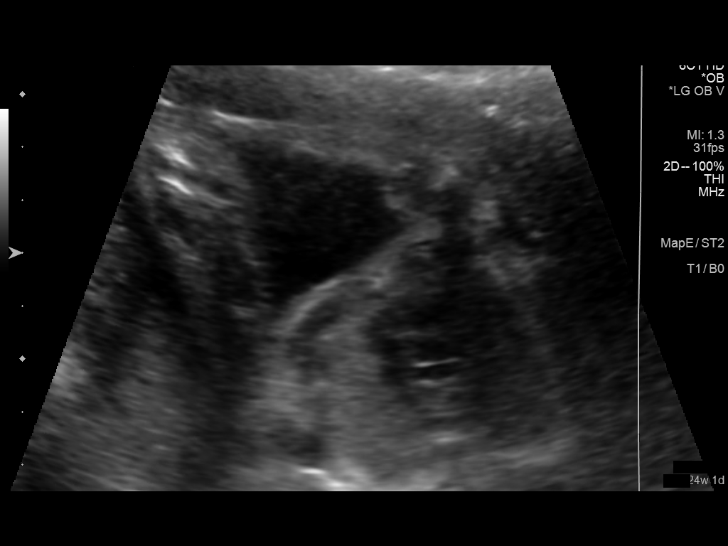
[im 28/93]
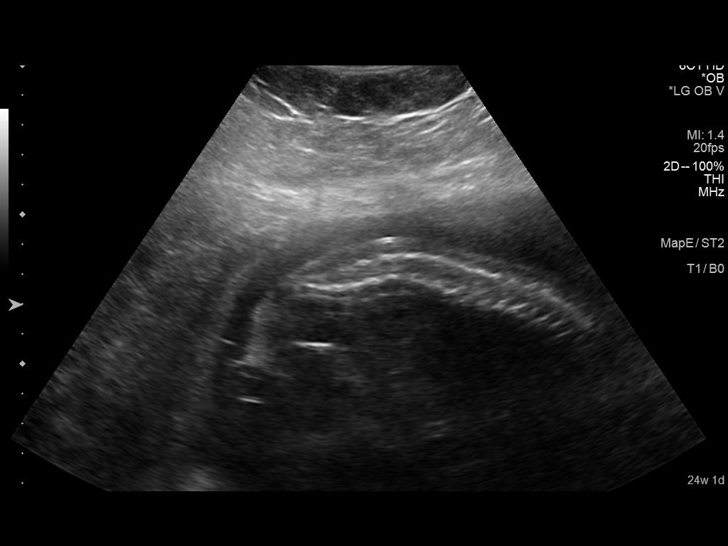
[im 35/93]
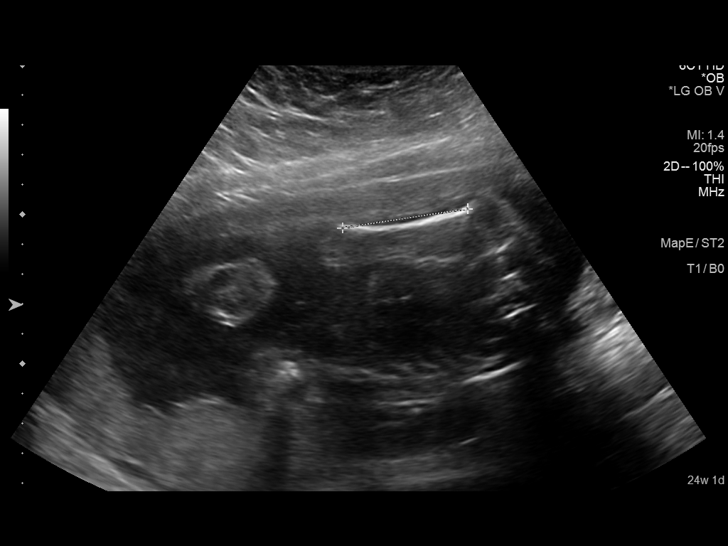
[im 41/93]
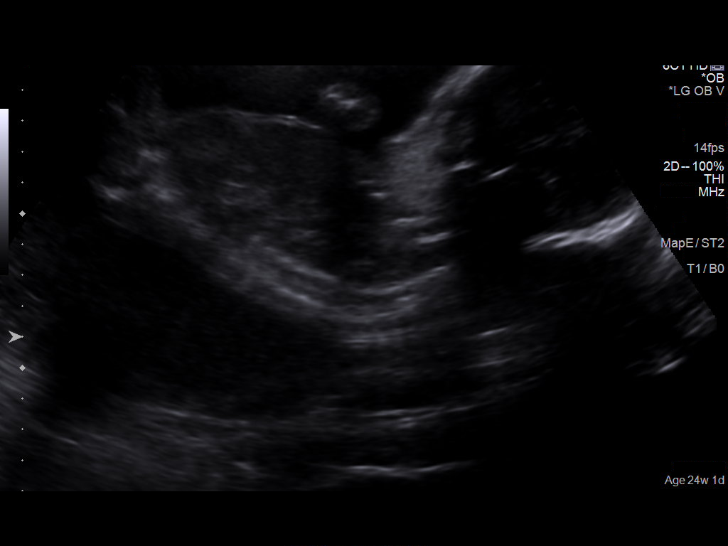
[im 52/93]
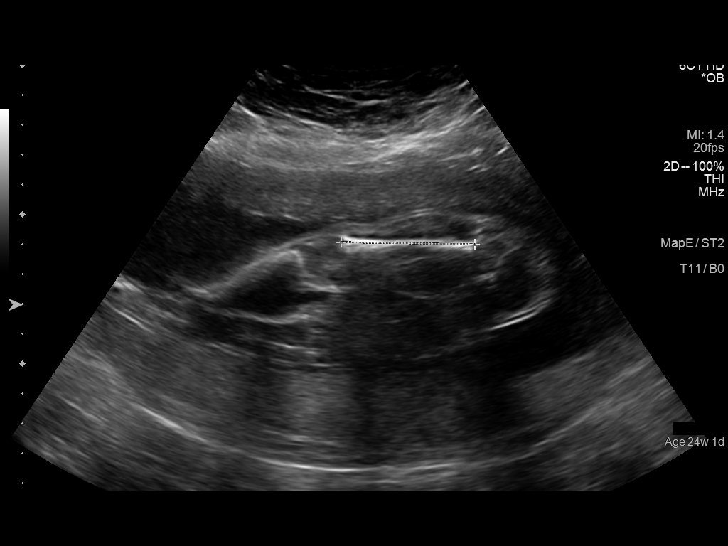
[im 58/93]
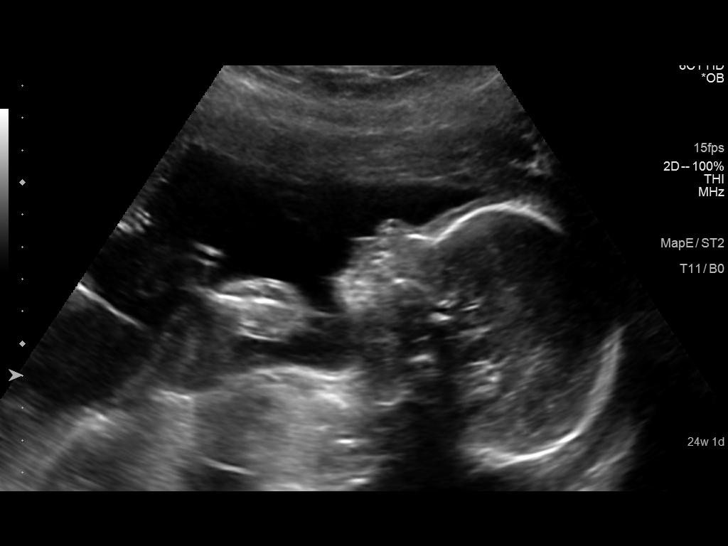
[im 65/93]
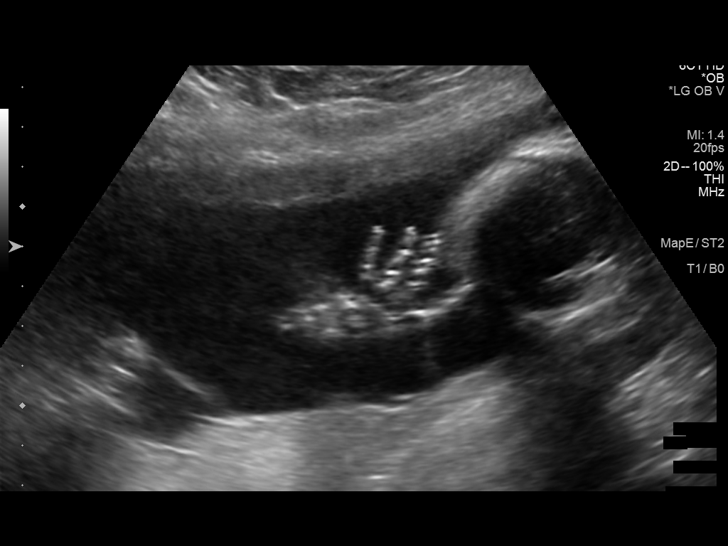
[im 75/93]
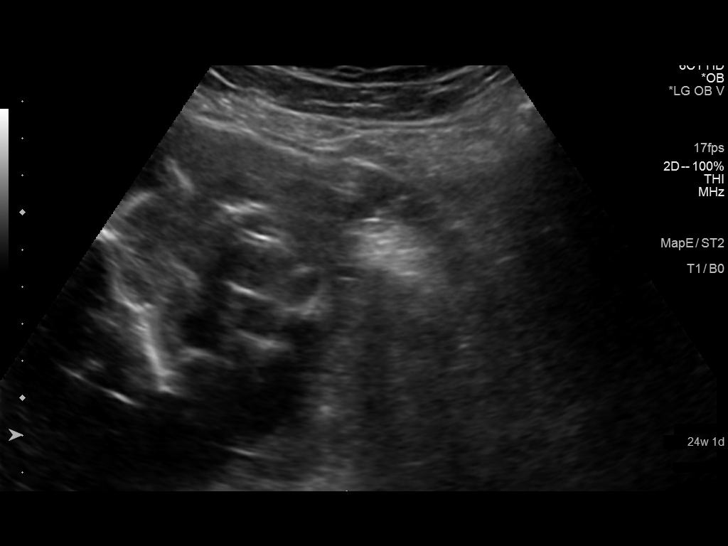
[im 82/93]
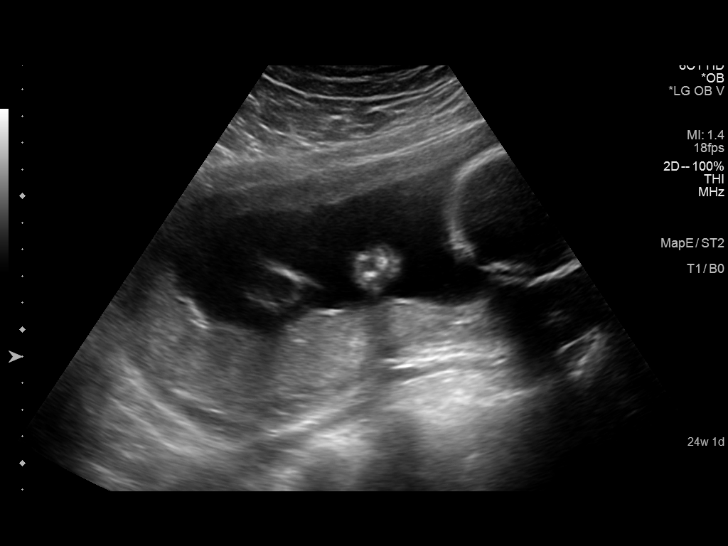
[im 89/93]
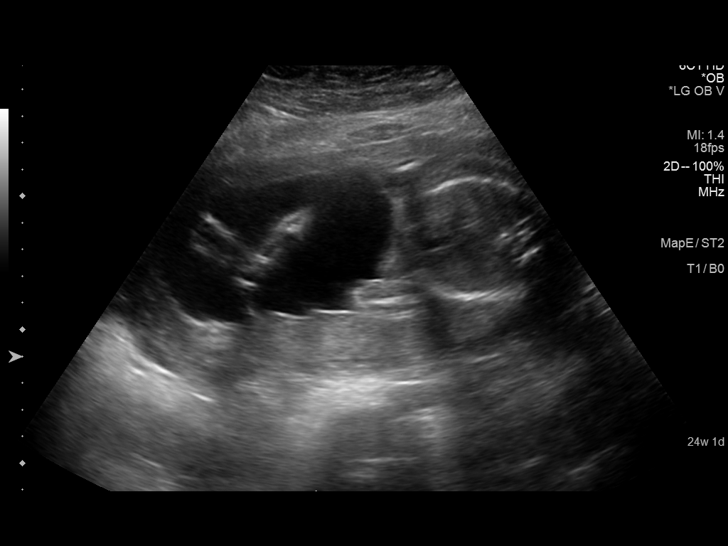

[12 of 28 positions shown; findings below may reference images not displayed]

OBSTETRICS REPORT
                      (Signed Final 10/24/2014 [DATE])

             AMISH

Service(s) Provided

 US OB FOLLOW UP                                       76816.1
Indications

 Follow-up incomplete fetal anatomic evaluation        Z36
 Previous pre-term deliveries
 24 weeks gestation of pregnancy
Fetal Evaluation

 Num Of Fetuses:    1
 Fetal Heart Rate:  141                          bpm
 Cardiac Activity:  Observed
 Presentation:      Cephalic
 Placenta:          Posterior, above cervical
                    os
 P. Cord            Marginal insertion
 Insertion:

 Amniotic Fluid
 AFI FV:      Subjectively within normal limits
                                              Larg Pckt:   4.64  cm
Biometry

 BPD:     62.9   mm    G. Age:  25w 3d                CI:          77.6   70 - 86
 OFD:     81.1   mm                                   FL/HC:       19.6   18.7 -

 HC:     231.5   mm    G. Age:  25w 1d       71   %   HC/AC:       1.13   1.05 -

 AC:     204.1   mm    G. Age:  25w 0d       68   %   FL/BPD:      72.0   71 - 87
 FL:      45.3   mm    G. Age:  25w 0d       64   %   FL/AC:       22.2   20 - 24
 HUM:     41.5   mm    G. Age:  25w 1d       64   %

 Est. FW:     763   gm    1 lb 11 oz     69  %
Gestational Age

 LMP:           28w 4d        Date:  04/07/14                 EDD:    01/12/15
 U/S Today:     25w 1d                                        EDD:    02/05/15
 Best:          24w 1d     Det. By:  Early Ultrasound         EDD:    02/12/15
                                     (07/17/14)
Anatomy

 Cranium:          Appears normal         Aortic Arch:       Appears normal
 Fetal Cavum:      Previously seen        Ductal Arch:       Appears normal
 Ventricles:       Appears normal         Diaphragm:         Appears normal
 Choroid Plexus:   Previously seen        Stomach:           Appears normal, left
                                                             sided
 Cerebellum:       Previously seen        Abdomen:           Appears normal
 Posterior Fossa:  Appears normal         Abdominal Wall:    Previously seen
 Nuchal Fold:      Previously seen        Cord Vessels:      Previously seen
 Face:             Orbits and profile     Kidneys:           Appear normal
                   previously seen
 Lips:             Previously seen        Bladder:           Appears normal
 Heart:            Appears normal         Spine:             Previously seen
                   (4CH, axis, and
                   situs)
 RVOT:             Appears normal         Lower              Previously seen
                                          Extremities:
 LVOT:             Appears normal         Upper              Previously seen
                                          Extremities:

 Other:  Fetus appears to be a male. Nasal bone visualized previously.  Heels
         and 5th digit visualized. Technically difficult due to maternal habitus
         and fetal position.
Targeted Anatomy

 Fetal Central Nervous System
 Lat. Ventricles:
Cervix Uterus Adnexa

 Cervical Length:    3.46      cm

 Cervix:       Normal appearance by transabdominal scan.
 Uterus:       No abnormality visualized.
 Cul De Sac:   No free fluid seen.
 Left Ovary:    No adnexal mass visualized.
 Right Ovary:   No adnexal mass visualized.

 Adnexa:     No adnexal mass visualized.
Impression

 SIUP at 24+1 weeks
 Normal interval anatomy; anatomic survey complete
 Normal amniotic fluid volume
 Appropriate interval growth with EFW at the 69th %tile
Recommendations

 Follow-up as clinically indicated

 MARTHINEZ with us.  Please do not hesitate to contact

## 2016-05-01 ENCOUNTER — Encounter (HOSPITAL_COMMUNITY): Payer: Self-pay | Admitting: *Deleted

## 2016-05-01 ENCOUNTER — Ambulatory Visit (HOSPITAL_COMMUNITY)
Admission: EM | Admit: 2016-05-01 | Discharge: 2016-05-01 | Disposition: A | Discharge status: Home / Self Care | Payer: Self-pay | Attending: Family Medicine | Admitting: Family Medicine

## 2016-05-01 DIAGNOSIS — A0811 Acute gastroenteropathy due to Norwalk agent: Secondary | ICD-10-CM

## 2016-05-01 MED ORDER — ONDANSETRON HCL 4 MG PO TABS: 4.0000 mg | ORAL_TABLET | Freq: Four times a day (QID) | ORAL | Status: AC

## 2016-05-01 MED ORDER — SODIUM CHLORIDE 0.9 % IV BOLUS (SEPSIS)
1000.0000 mL | Freq: Once | INTRAVENOUS | Status: AC
  Administered 2016-05-01: 1000 mL via INTRAVENOUS

## 2016-05-01 MED ORDER — ONDANSETRON HCL 4 MG/2ML IJ SOLN
4.0000 mg | Freq: Once | INTRAMUSCULAR | Status: AC
  Administered 2016-05-01: 4 mg via INTRAVENOUS

## 2016-05-01 MED ORDER — ONDANSETRON HCL 4 MG/2ML IJ SOLN
INTRAMUSCULAR | Status: AC
  Filled 2016-05-01: qty 2

## 2016-05-01 NOTE — ED Provider Notes (Signed)
CSN: [id number]     Arrival date & time 05/01/16  1511 History   First MD Initiated Contact with Patient 05/01/16 1625     Chief Complaint  Patient presents with  . Nausea   (Consider location/radiation/quality/duration/timing/severity/associated sxs/prior Treatment) Patient is a 25 y.o. female presenting with vomiting. The history is provided by the patient.  Emesis Severity:  Moderate Duration:  2 days Quality:  Stomach contents Progression:  Unchanged Chronicity:  New Relieved by:  Nothing Worsened by:  Nothing tried Ineffective treatments:  None tried Associated symptoms: abdominal pain and diarrhea   Associated symptoms: no fever and no sore throat   Risk factors: no sick contacts, no suspect food intake and no travel to endemic areas     Past Medical History  Diagnosis Date  . Severe preeclampsia 03/06/2015    Noted during delivery, had magnesium sulfate. will need ASA prophylaxis subsequent pregnancies   Past Surgical History  Procedure Laterality Date  . Cholecystectomy     History reviewed. No pertinent family history. Social History  Substance Use Topics  . Smoking status: Never Smoker   . Smokeless tobacco: Never Used  . Alcohol Use: No   OB History    Gravida Para Term Preterm AB TAB SAB Ectopic Multiple Living   4 3 2 1 1  0 1 0 0 3     Review of Systems  Constitutional: Negative.   HENT: Negative.  Negative for sore throat.   Gastrointestinal: Positive for nausea, vomiting, abdominal pain and diarrhea. Negative for blood in stool.  Genitourinary: Negative.   Skin: Negative.   All other systems reviewed and are negative.   Allergies  Review of patient's allergies indicates no known allergies.  Home Medications   Prior to Admission medications   Medication Sig Start Date End Date Taking? Authorizing Provider  cyclobenzaprine (FLEXERIL) 10 MG tablet Take 1 tablet (10 mg total) by mouth every 8 (eight) hours as needed for muscle spasms. Patient not  taking: Reported on 01/28/2015 09/13/14   Reva Boresanya S Pratt, MD  ibuprofen (ADVIL,MOTRIN) 600 MG tablet Take 1 tablet (600 mg total) by mouth every 6 (six) hours. Patient not taking: Reported on 03/06/2015 01/31/15   Tilda BurrowJohn V Ferguson, MD  ondansetron (ZOFRAN) 4 MG tablet Take 1 tablet (4 mg total) by mouth every 6 (six) hours. Prn n/v. 05/01/16   Linna HoffJames D Kindl, MD  ranitidine (ZANTAC) 75 MG tablet Take 75 mg by mouth 2 (two) times daily.    Historical Provider, MD   Meds Ordered and Administered this Visit   Medications  sodium chloride 0.9 % bolus 1,000 mL (1,000 mLs Intravenous Given 05/01/16 1701)  ondansetron (ZOFRAN) injection 4 mg (4 mg Intravenous Given 05/01/16 1701)    BP 130/70 mmHg  Pulse 80  Temp(Src) 98.6 F (37 C) (Oral)  Resp 18  SpO2 100% No data found.   Physical Exam  Constitutional: She is oriented to person, place, and time. She appears well-developed and well-nourished.  HENT:  Mouth/Throat: Oropharynx is clear and moist.  Eyes: Conjunctivae and EOM are normal. Pupils are equal, round, and reactive to light.  Neck: Normal range of motion. Neck supple.  Cardiovascular: Normal rate, normal heart sounds and intact distal pulses.   Pulmonary/Chest: Effort normal and breath sounds normal.  Abdominal: Soft. Bowel sounds are normal. She exhibits no distension and no mass. There is no tenderness. There is no rebound and no guarding.  Lymphadenopathy:    She has no cervical adenopathy.  Neurological: She  is alert and oriented to person, place, and time.  Skin: Skin is warm and dry.  Nursing note and vitals reviewed.   ED Course  Procedures (including critical care time)  Labs Review Labs Reviewed - No data to display  Imaging Review No results found.   Visual Acuity Review  Right Eye Distance:   Left Eye Distance:   Bilateral Distance:    Right Eye Near:   Left Eye Near:    Bilateral Near:         MDM   1. Gastroenteritis due to norovirus    Sx improved  after ivf,, tolerating po gatorade, no n/v/d.    Linna HoffJames D Kindl, MD 05/01/16 2043

## 2016-05-01 NOTE — ED Notes (Signed)
Pt  Reports   Symptoms  Of  Nausea    Vomiting  And  Diarrhea  For  Several  Days          pt  Reports  Some  Abdominal  Pain  As  Well     No active  Vomiting  At  This  Time
